# Patient Record
Sex: Female | Born: 1955 | Race: White | Hispanic: No | Marital: Married | Smoking: Former smoker
Health system: Southern US, Community
[De-identification: ages and names within clinical notes are randomized; demographics above are authoritative.]

## PROBLEM LIST (undated history)

## (undated) DIAGNOSIS — F419 Anxiety disorder, unspecified: Secondary | ICD-10-CM

## (undated) DIAGNOSIS — R51 Headache: Secondary | ICD-10-CM

## (undated) DIAGNOSIS — I8390 Asymptomatic varicose veins of unspecified lower extremity: Secondary | ICD-10-CM

## (undated) DIAGNOSIS — M5416 Radiculopathy, lumbar region: Secondary | ICD-10-CM

## (undated) DIAGNOSIS — T8859XA Other complications of anesthesia, initial encounter: Secondary | ICD-10-CM

## (undated) DIAGNOSIS — C50919 Malignant neoplasm of unspecified site of unspecified female breast: Secondary | ICD-10-CM

## (undated) DIAGNOSIS — Z923 Personal history of irradiation: Secondary | ICD-10-CM

## (undated) DIAGNOSIS — R002 Palpitations: Secondary | ICD-10-CM

## (undated) DIAGNOSIS — T4145XA Adverse effect of unspecified anesthetic, initial encounter: Secondary | ICD-10-CM

## (undated) DIAGNOSIS — R112 Nausea with vomiting, unspecified: Secondary | ICD-10-CM

## (undated) DIAGNOSIS — E785 Hyperlipidemia, unspecified: Secondary | ICD-10-CM

## (undated) DIAGNOSIS — K8020 Calculus of gallbladder without cholecystitis without obstruction: Secondary | ICD-10-CM

## (undated) DIAGNOSIS — R519 Headache, unspecified: Secondary | ICD-10-CM

## (undated) DIAGNOSIS — G8929 Other chronic pain: Secondary | ICD-10-CM

## (undated) DIAGNOSIS — Z9889 Other specified postprocedural states: Secondary | ICD-10-CM

## (undated) DIAGNOSIS — N809 Endometriosis, unspecified: Secondary | ICD-10-CM

## (undated) DIAGNOSIS — H8110 Benign paroxysmal vertigo, unspecified ear: Secondary | ICD-10-CM

## (undated) HISTORY — DX: Asymptomatic varicose veins of unspecified lower extremity: I83.90

## (undated) HISTORY — DX: Other chronic pain: G89.29

## (undated) HISTORY — PX: TONSILLECTOMY AND ADENOIDECTOMY: SUR1326

## (undated) HISTORY — DX: Anxiety disorder, unspecified: F41.9

## (undated) HISTORY — DX: Hyperlipidemia, unspecified: E78.5

## (undated) HISTORY — DX: Headache: R51

## (undated) HISTORY — PX: BREAST LUMPECTOMY: SHX2

## (undated) HISTORY — DX: Benign paroxysmal vertigo, unspecified ear: H81.10

## (undated) HISTORY — DX: Headache, unspecified: R51.9

## (undated) HISTORY — DX: Calculus of gallbladder without cholecystitis without obstruction: K80.20

## (undated) HISTORY — PX: COLONOSCOPY W/ POLYPECTOMY: SHX1380

## (undated) HISTORY — DX: Radiculopathy, lumbar region: M54.16

## (undated) HISTORY — DX: Endometriosis, unspecified: N80.9

## (undated) HISTORY — PX: CERVICAL POLYPECTOMY: SHX88

---

## 1997-10-28 ENCOUNTER — Other Ambulatory Visit: Admission: RE | Admit: 1997-10-28 | Discharge: 1997-10-28 | Payer: Self-pay | Admitting: Obstetrics and Gynecology

## 1998-10-29 ENCOUNTER — Other Ambulatory Visit: Admission: RE | Admit: 1998-10-29 | Discharge: 1998-10-29 | Payer: Self-pay | Admitting: Obstetrics and Gynecology

## 1999-12-08 ENCOUNTER — Other Ambulatory Visit: Admission: RE | Admit: 1999-12-08 | Discharge: 1999-12-08 | Payer: Self-pay | Admitting: Obstetrics and Gynecology

## 2001-02-21 ENCOUNTER — Other Ambulatory Visit: Admission: RE | Admit: 2001-02-21 | Discharge: 2001-02-21 | Payer: Self-pay | Admitting: Obstetrics and Gynecology

## 2002-04-08 ENCOUNTER — Other Ambulatory Visit: Admission: RE | Admit: 2002-04-08 | Discharge: 2002-04-08 | Payer: Self-pay | Admitting: Obstetrics and Gynecology

## 2003-07-25 ENCOUNTER — Other Ambulatory Visit: Admission: RE | Admit: 2003-07-25 | Discharge: 2003-07-25 | Payer: Self-pay | Admitting: Obstetrics and Gynecology

## 2010-06-01 ENCOUNTER — Emergency Department (HOSPITAL_COMMUNITY)
Admission: EM | Admit: 2010-06-01 | Discharge: 2010-06-02 | Disposition: A | Discharge status: Home / Self Care | Payer: BC Managed Care – PPO | Attending: Emergency Medicine | Admitting: Emergency Medicine

## 2010-06-01 DIAGNOSIS — R161 Splenomegaly, not elsewhere classified: Secondary | ICD-10-CM | POA: Insufficient documentation

## 2010-06-01 DIAGNOSIS — K8020 Calculus of gallbladder without cholecystitis without obstruction: Secondary | ICD-10-CM | POA: Insufficient documentation

## 2010-06-01 DIAGNOSIS — K7689 Other specified diseases of liver: Secondary | ICD-10-CM | POA: Insufficient documentation

## 2010-06-01 DIAGNOSIS — R109 Unspecified abdominal pain: Secondary | ICD-10-CM | POA: Insufficient documentation

## 2010-06-01 LAB — URINALYSIS, ROUTINE W REFLEX MICROSCOPIC
Glucose, UA: NEGATIVE mg/dL
Protein, ur: NEGATIVE mg/dL
Specific Gravity, Urine: 1.025 (ref 1.005–1.030)
pH: 5 (ref 5.0–8.0)

## 2010-06-01 LAB — DIFFERENTIAL
Eosinophils Absolute: 0.1 10*3/uL (ref 0.0–0.7)
Eosinophils Relative: 0 % (ref 0–5)
Lymphs Abs: 0.8 10*3/uL (ref 0.7–4.0)

## 2010-06-01 LAB — URINE MICROSCOPIC-ADD ON

## 2010-06-01 LAB — CBC
MCH: 30 pg (ref 26.0–34.0)
MCV: 89.2 fL (ref 78.0–100.0)
Platelets: 194 10*3/uL (ref 150–400)
RDW: 14.2 % (ref 11.5–15.5)
WBC: 14.1 10*3/uL — ABNORMAL HIGH (ref 4.0–10.5)

## 2010-06-02 ENCOUNTER — Emergency Department (HOSPITAL_COMMUNITY): Payer: BC Managed Care – PPO

## 2010-06-02 LAB — COMPREHENSIVE METABOLIC PANEL
Albumin: 4.5 g/dL (ref 3.5–5.2)
BUN: 15 mg/dL (ref 6–23)
Calcium: 9.4 mg/dL (ref 8.4–10.5)
Creatinine, Ser: 0.88 mg/dL (ref 0.4–1.2)
Potassium: 3.9 mEq/L (ref 3.5–5.1)
Total Protein: 7.5 g/dL (ref 6.0–8.3)

## 2010-06-14 ENCOUNTER — Other Ambulatory Visit (HOSPITAL_COMMUNITY): Payer: BC Managed Care – PPO

## 2010-06-18 ENCOUNTER — Ambulatory Visit (HOSPITAL_COMMUNITY): Admission: RE | Admit: 2010-06-18 | Payer: BC Managed Care – PPO | Source: Ambulatory Visit | Admitting: Surgery

## 2012-01-03 ENCOUNTER — Encounter: Payer: Self-pay | Admitting: Internal Medicine

## 2012-02-08 HISTORY — PX: BACK SURGERY: SHX140

## 2012-02-23 ENCOUNTER — Encounter: Payer: BC Managed Care – PPO | Admitting: Internal Medicine

## 2012-03-05 ENCOUNTER — Emergency Department (HOSPITAL_COMMUNITY)
Admission: EM | Admit: 2012-03-05 | Discharge: 2012-03-05 | Disposition: A | Discharge status: Home / Self Care | Payer: BC Managed Care – PPO | Attending: Emergency Medicine | Admitting: Emergency Medicine

## 2012-03-05 ENCOUNTER — Encounter (HOSPITAL_COMMUNITY): Payer: Self-pay | Admitting: Emergency Medicine

## 2012-03-05 DIAGNOSIS — IMO0002 Reserved for concepts with insufficient information to code with codable children: Secondary | ICD-10-CM | POA: Insufficient documentation

## 2012-03-05 DIAGNOSIS — G8929 Other chronic pain: Secondary | ICD-10-CM | POA: Insufficient documentation

## 2012-03-05 DIAGNOSIS — M5416 Radiculopathy, lumbar region: Secondary | ICD-10-CM

## 2012-03-05 MED ORDER — OXYCODONE HCL 15 MG PO TABS: 15.0000 mg | ORAL_TABLET | ORAL | Status: DC | PRN

## 2012-03-05 MED ORDER — METHYLPREDNISOLONE 4 MG PO KIT: PACK | ORAL | Status: DC

## 2012-03-05 MED ORDER — IBUPROFEN 800 MG PO TABS: 800.0000 mg | ORAL_TABLET | Freq: Three times a day (TID) | ORAL | Status: DC

## 2012-03-05 NOTE — ED Notes (Signed)
States took a pain pill already vicodin 1  1030-11 am today

## 2012-03-05 NOTE — ED Provider Notes (Signed)
History     CSN: [id number]  Arrival date & time 03/05/12  1139   First MD Initiated Contact with Patient 03/05/12 1323      No chief complaint on file.   (Consider location/radiation/quality/duration/timing/severity/associated sxs/prior treatment) HPI  Rhonda Cooley is a 57 y.o. female complaining of severe exacerbation 2 chronic low back pain that radiates down the left leg past the knee. Patient had an evaluation by both OB/GYN her primary care physician she was instructed to come to the ED. She has been hesitant to take pain medication because "she doesn't like taking pills." She has been prescribed Percocet 10 mg and she has taken half of a pill she denies any loss of bowel or bladder control, she is able to ambulate however it is painful, she denies any numbness or paresthesia, fever, history of IV drug use, history of cancer.  History reviewed. No pertinent past medical history.  History reviewed. No pertinent past surgical history.  History reviewed. No pertinent family history.  History  Substance Use Topics  . Smoking status: Not on file  . Smokeless tobacco: Not on file  . Alcohol Use: Not on file    OB History    Grav Para Term Preterm Abortions TAB SAB Ect Mult Living                  Review of Systems  Constitutional: Negative for fever.  Respiratory: Negative for shortness of breath.   Cardiovascular: Negative for chest pain.  Gastrointestinal: Negative for nausea, vomiting, abdominal pain and diarrhea.  Musculoskeletal: Positive for back pain.  All other systems reviewed and are negative.    Allergies  Review of patient's allergies indicates no known allergies.  Home Medications  No current outpatient prescriptions on file.  BP 132/83  Pulse 93  Temp 98.6 F (37 C)  SpO2 97%  Physical Exam  Nursing note and vitals reviewed. Constitutional: She is oriented to person, place, and time. She appears well-developed and well-nourished. No distress.    HENT:  Head: Normocephalic and atraumatic.  Mouth/Throat: Oropharynx is clear and moist.  Eyes: Conjunctivae normal and EOM are normal. Pupils are equal, round, and reactive to light.  Neck: Normal range of motion.  Cardiovascular: Normal rate, regular rhythm and intact distal pulses.   Pulmonary/Chest: Effort normal and breath sounds normal. No stridor. No respiratory distress. She has no wheezes. She has no rales. She exhibits no tenderness.  Abdominal: Soft. Bowel sounds are normal. She exhibits no distension and no mass. There is no tenderness. There is no rebound and no guarding.  Musculoskeletal: Normal range of motion.       Straight leg raise is positive on ipsilateral side at 40 and also positive on the contralateral side at 60  Neurological: She is alert and oriented to person, place, and time.       Strength is 5 out of 5x4 extremities, normal patellar reflexes, patient ambulates independently with a coordinated, slow and slightly antalgic gait.  Skin: Skin is warm.  Psychiatric: She has a normal mood and affect.    ED Course  Procedures (including critical care time)  Labs Reviewed - No data to display No results found.   1. Left lumbar radiculopathy       MDM  Patient states that she is sent to the ED by her primary care doctors work and for a MRI. Explained to her that an MRI is not indicated on an emergent basis based on her symptoms. And also  that the cost of the MRI might not be covered by her insurance if not emergently needed. Patient is very upset about this. I have offered her more substantial pain control medications which she has refused. On revisiting this she accepts a prescription for pain control medications and refuses anything in the ED. I will start her on a Medrol Dosepak, Motrin 800 mg and and Roxicodone 15 milligrams every 4 hours when necessary.  Advised her to follow with her primary care for arrangement of an outpatient MRI and have given her Dr.  Earl Gala contact information.   Pt verbalized understanding and agrees with care plan. Outpatient follow-up and return precautions given.    New Prescriptions   IBUPROFEN (ADVIL,MOTRIN) 800 MG TABLET    Take 1 tablet (800 mg total) by mouth 3 (three) times daily.   METHYLPREDNISOLONE (MEDROL DOSEPAK) 4 MG TABLET    As directed by package insert   OXYCODONE (ROXICODONE) 15 MG IMMEDIATE RELEASE TABLET    Take 1 tablet (15 mg total) by mouth every 4 (four) hours as needed for pain.          Wynetta Emery, PA-C 03/06/12 [phone]

## 2012-03-05 NOTE — ED Notes (Signed)
Pt refuses medication  

## 2012-03-05 NOTE — ED Notes (Signed)
Pain in back going down left leg no injury she states  Has had the same problem for over a year went to see gyn and they did not find anything

## 2012-03-06 ENCOUNTER — Encounter: Payer: BC Managed Care – PPO | Admitting: Internal Medicine

## 2012-03-06 NOTE — ED Provider Notes (Signed)
I personally performed the services described in this documentation, which was scribed in my presence. The recorded information has been reviewed and is accurate.   Carleene Cooper III, MD 03/06/12 [phone]

## 2012-03-08 ENCOUNTER — Other Ambulatory Visit: Payer: Self-pay | Admitting: Family Medicine

## 2012-03-08 DIAGNOSIS — M5410 Radiculopathy, site unspecified: Secondary | ICD-10-CM

## 2012-03-09 ENCOUNTER — Ambulatory Visit
Admission: RE | Admit: 2012-03-09 | Discharge: 2012-03-09 | Disposition: A | Discharge status: Home / Self Care | Payer: BC Managed Care – PPO | Source: Ambulatory Visit | Attending: Family Medicine | Admitting: Family Medicine

## 2012-03-09 DIAGNOSIS — M5410 Radiculopathy, site unspecified: Secondary | ICD-10-CM

## 2012-03-12 ENCOUNTER — Other Ambulatory Visit: Payer: BC Managed Care – PPO

## 2012-03-13 ENCOUNTER — Other Ambulatory Visit: Payer: BC Managed Care – PPO

## 2012-11-06 ENCOUNTER — Ambulatory Visit: Payer: BC Managed Care – PPO | Attending: Neurosurgery | Admitting: Physical Therapy

## 2012-11-06 DIAGNOSIS — IMO0001 Reserved for inherently not codable concepts without codable children: Secondary | ICD-10-CM | POA: Insufficient documentation

## 2012-11-06 DIAGNOSIS — M545 Low back pain, unspecified: Secondary | ICD-10-CM | POA: Insufficient documentation

## 2012-11-06 DIAGNOSIS — R5381 Other malaise: Secondary | ICD-10-CM | POA: Insufficient documentation

## 2012-11-08 ENCOUNTER — Ambulatory Visit: Payer: BC Managed Care – PPO | Attending: Neurosurgery | Admitting: Physical Therapy

## 2012-11-08 DIAGNOSIS — R5381 Other malaise: Secondary | ICD-10-CM | POA: Insufficient documentation

## 2012-11-08 DIAGNOSIS — IMO0001 Reserved for inherently not codable concepts without codable children: Secondary | ICD-10-CM | POA: Insufficient documentation

## 2012-11-08 DIAGNOSIS — M545 Low back pain, unspecified: Secondary | ICD-10-CM | POA: Insufficient documentation

## 2012-11-13 ENCOUNTER — Ambulatory Visit: Payer: BC Managed Care – PPO | Admitting: Physical Therapy

## 2012-11-20 ENCOUNTER — Ambulatory Visit: Payer: BC Managed Care – PPO | Admitting: Physical Therapy

## 2012-11-21 ENCOUNTER — Ambulatory Visit: Payer: BC Managed Care – PPO | Admitting: Physical Therapy

## 2012-11-28 ENCOUNTER — Ambulatory Visit: Payer: BC Managed Care – PPO | Admitting: Physical Therapy

## 2012-12-05 ENCOUNTER — Ambulatory Visit: Payer: BC Managed Care – PPO | Admitting: Physical Therapy

## 2012-12-13 ENCOUNTER — Ambulatory Visit: Payer: BC Managed Care – PPO | Attending: Neurosurgery | Admitting: Physical Therapy

## 2012-12-13 DIAGNOSIS — IMO0001 Reserved for inherently not codable concepts without codable children: Secondary | ICD-10-CM | POA: Insufficient documentation

## 2012-12-13 DIAGNOSIS — M545 Low back pain, unspecified: Secondary | ICD-10-CM | POA: Insufficient documentation

## 2012-12-13 DIAGNOSIS — R5381 Other malaise: Secondary | ICD-10-CM | POA: Insufficient documentation

## 2013-01-01 ENCOUNTER — Ambulatory Visit: Payer: BC Managed Care – PPO | Admitting: Physical Therapy

## 2015-03-06 ENCOUNTER — Encounter: Payer: Self-pay | Admitting: *Deleted

## 2015-03-09 ENCOUNTER — Encounter: Payer: Self-pay | Admitting: Diagnostic Neuroimaging

## 2015-03-09 ENCOUNTER — Ambulatory Visit (INDEPENDENT_AMBULATORY_CARE_PROVIDER_SITE_OTHER): Payer: BLUE CROSS/BLUE SHIELD | Admitting: Diagnostic Neuroimaging

## 2015-03-09 VITALS — BP 155/86 | HR 98 | Ht 63.0 in | Wt 212.4 lb

## 2015-03-09 DIAGNOSIS — R42 Dizziness and giddiness: Secondary | ICD-10-CM

## 2015-03-09 DIAGNOSIS — R269 Unspecified abnormalities of gait and mobility: Secondary | ICD-10-CM | POA: Diagnosis not present

## 2015-03-09 NOTE — Patient Instructions (Addendum)
Thank you for coming to see [place] at [facility]. I hope we have been able to provide you high quality care today.  You may receive a patient satisfaction survey over the next few weeks. We would appreciate your feedback and comments so that we may continue to improve ourselves and the health of our patients.  - I will check MRI brain  - Try vestibular therapy evaluation   ~~~~~~~~~~~~~~~~~~~~~~~~~~~~~~~~~~~~~~~~~~~~~~~~~~~~~~~~~~~~~~~~~  DR. PENUMALLI'S GUIDE TO HAPPY AND HEALTHY LIVING These are some of my general health and wellness recommendations. Some of them may apply to you better than others. Please use common sense as you try these suggestions and feel free to ask me any questions.   ACTIVITY/FITNESS Mental, social, emotional and physical stimulation are very important for brain and body health. Try learning a new activity (arts, music, language, sports, games).  Keep moving your body to the best of your abilities. You can do this at home, inside or outside, the park, community center, gym or anywhere you like. Consider a physical therapist or personal trainer to get started. Consider the app Sworkit. Fitness trackers such as smart-watches, smart-phones or Fitbits can help as well.   NUTRITION Eat more plants: colorful vegetables, nuts, seeds and berries.  Eat less sugar, salt, preservatives and processed foods.  Avoid toxins such as cigarettes and alcohol.  Drink water when you are thirsty. Warm water with a slice of lemon is an excellent morning drink to start the day.  Consider these websites for more information The Nutrition Source (https://www.henry-hernandez.biz/) Precision Nutrition (WindowBlog.ch)   RELAXATION Consider practicing mindfulness meditation or other relaxation techniques such as deep breathing, prayer, yoga, tai chi, massage. See website mindful.org or the apps Headspace or Calm to help get  started.   SLEEP Try to get at least 7-8+ hours sleep per day. Regular exercise and reduced caffeine will help you sleep better. Practice good sleep hygeine techniques. See website sleep.org for more information.   PLANNING Prepare estate planning, living will, healthcare POA documents. Sometimes this is best planned with the help of an attorney. Theconversationproject.org and agingwithdignity.org are excellent resources.

## 2015-03-09 NOTE — Progress Notes (Signed)
[facility]  PATIENT: Rhonda Cooley DOB: February 13, 1955  REFERRING CLINICIAN: Loraine Leriche HISTORY FROM: patient  REASON FOR VISIT: new consult    HISTORICAL  CHIEF COMPLAINT:  Chief Complaint  Patient presents with  . Vertigo    rm 7, New Patient, "one episode of dizziness, nausea, sensation of leaning/walking when I am not"    HISTORY OF PRESENT ILLNESS:   60 year old right-handed female here for evaluation of dizziness. 01/31/15 patient woke up with sensation of room spinning. She felt rapid heart rate, sweaty sensation of the back of her head and nausea and vomiting. Severe symptoms lasted 1-2 minutes. She had an uneasy sensation for a few hours. Over the next few weeks patient would have intermittent episodes of a moving sensation even though she was not moving. She had some balance difficulty and "woozy sensation". She's had some generalized weakness sensation. Symptoms may last for a few days and then remit for a few days.  Patient under increasing stress related to personal relationship issues since October 2016. No recent traumas or infections. Patient has been having higher blood pressure readings over last few weeks. Patient's son who is 64 years old also has similar symptoms.   REVIEW OF SYSTEMS: Full 14 system review of systems performed and notable only for dizziness anxiety.  ALLERGIES: No Known Allergies  HOME MEDICATIONS:  Outpatient Encounter Prescriptions as of 03/09/2015  Medication Sig  . aspirin 81 MG tablet Take 81 mg by mouth daily. 03/09/15 Takes on and off  . diazepam (VALIUM) 5 MG tablet 5 mg. Reported on 03/09/2015  . [DISCONTINUED] ibuprofen (ADVIL,MOTRIN) 200 MG tablet Take 200 mg by mouth every 4 (four) hours as needed. For pain  . [DISCONTINUED] ibuprofen (ADVIL,MOTRIN) 800 MG tablet Take 1 tablet (800 mg total) by mouth 3 (three) times daily.  . [DISCONTINUED] methylPREDNISolone (MEDROL DOSEPAK) 4 MG tablet As directed by package  insert  . [DISCONTINUED] oxyCODONE (ROXICODONE) 15 MG immediate release tablet Take 1 tablet (15 mg total) by mouth every 4 (four) hours as needed for pain.   No facility-administered encounter medications on file as of 03/09/2015.    PAST MEDICAL HISTORY: Past Medical History  Diagnosis Date  . BPPV (benign paroxysmal positional vertigo)   . Anxiety   . Gall stones   . Lumbar radiculopathy   . Chronic headaches   . Varicose veins   . Endometriosis     "went in and cleaned"    PAST SURGICAL HISTORY: Past Surgical History  Procedure Laterality Date  . Back surgery  2014  . Tonsillectomy and adenoidectomy      as child    FAMILY HISTORY: Family History  Problem Relation Age of Onset  . Alzheimer's disease Mother   . Hypertension Sister   . Hypertension Brother     SOCIAL HISTORY:  Social History   Social History  . Marital Status: Married    Spouse Name: Marcello Moores  . Number of Children: 3  . Years of Education: 14   Occupational History  .      works from home   Social History Main Topics  . Smoking status: Former Smoker    Types: Cigarettes    Quit date: 03/08/1988  . Smokeless tobacco: Not on file  . Alcohol Use: No  . Drug Use: No  . Sexual Activity: Not on file   Other Topics Concern  . Not on file   Social History Narrative   Lives at home with husband, child  Caffeine use- tea once a week     PHYSICAL EXAM  GENERAL EXAM/CONSTITUTIONAL: Vitals:  Filed Vitals:   03/09/15 1108  BP: 155/86  Pulse: 98  Height: '5\' 3"'$  (1.6 m)  Weight: 212 lb 6.4 oz (96.344 kg)     Body mass index is 37.63 kg/(m^2).  Visual Acuity Screening   Right eye Left eye Both eyes  Without correction:     With correction: 20/30 20/30      Patient is in no distress; well developed, nourished and groomed; neck is supple  CARDIOVASCULAR:  Examination of carotid arteries is normal; no carotid bruits  Regular rate and rhythm, no murmurs  Examination of  peripheral vascular system by observation and palpation is normal  EYES:  Ophthalmoscopic exam of optic discs and posterior segments is normal; no papilledema or hemorrhages  MUSCULOSKELETAL:  Gait, strength, tone, movements noted in Neurologic exam below  NEUROLOGIC: MENTAL STATUS:  No flowsheet data found.  awake, alert, oriented to person, place and time  recent and remote memory intact  normal attention and concentration  language fluent, comprehension intact, naming intact,   fund of knowledge appropriate  CRANIAL NERVE:   2nd - no papilledema on fundoscopic exam  2nd, 3rd, 4th, 6th - pupils equal and reactive to light, visual fields full to confrontation, extraocular muscles intact, no nystagmus  5th - facial sensation symmetric  7th - facial strength symmetric  8th - hearing intact  9th - palate elevates symmetrically, uvula midline  11th - shoulder shrug symmetric  12th - tongue protrusion midline  DIX HALLPIKE TESTING WITH HEAD TURNED LEFT TRIGGERS MILD SYMPTOMS, WITHOUT NYSTAGMUS  MOTOR:   normal bulk and tone, full strength in the BUE, BLE  SENSORY:   normal and symmetric to light touch, temperature, vibration  COORDINATION:   finger-nose-finger, fine finger movements normal  REFLEXES:   deep tendon reflexes present and symmetric  GAIT/STATION:   narrow based gait; romberg is negative    DIAGNOSTIC DATA (LABS, IMAGING, TESTING) - I reviewed patient records, labs, notes, testing and imaging myself where available.  Lab Results  Component Value Date   WBC 14.1* 06/01/2010   HGB 13.0 06/01/2010   HCT 38.7 06/01/2010   MCV 89.2 06/01/2010   PLT 194 06/01/2010      Component Value Date/Time   NA 138 06/01/2010 2310   K 3.9 06/01/2010 2310   CL 105 06/01/2010 2310   CO2 20 06/01/2010 2310   GLUCOSE 143* 06/01/2010 2310   BUN 15 06/01/2010 2310   CREATININE 0.88 06/01/2010 2310   CALCIUM 9.4 06/01/2010 2310   PROT 7.5  06/01/2010 2310   ALBUMIN 4.5 06/01/2010 2310   AST 41* 06/01/2010 2310   ALT 56* 06/01/2010 2310   ALKPHOS 103 06/01/2010 2310   BILITOT 1.4* 06/01/2010 2310   GFRNONAA >60 06/01/2010 2310   GFRAA  06/01/2010 2310    >60        The eGFR has been calculated using the MDRD equation. This calculation has not been validated in all clinical situations. eGFR's persistently <60 mL/min signify possible Chronic Kidney Disease.   No results found for: CHOL, HDL, LDLCALC, LDLDIRECT, TRIG, CHOLHDL No results found for: HGBA1C No results found for: VITAMINB12 No results found for: TSH      ASSESSMENT AND PLAN  60 y.o. year old female here with new onset intermittent vertigo, dizziness, balance difficulty, generalized weakness, since 01/31/15.   Ddx: benign positional vertigo, viral/post-viral labyrinthitis, other secondary causes (stroke, inflammation,  autoimmune, structural)  Vertigo - Plan: MR Brain W Wo Contrast, PT vestibular rehab  Gait difficulty - Plan: MR Brain W Wo Contrast, PT vestibular rehab    PLAN: - I will check MRI brain to rule out secondary causes - Try physical vestibular therapy  - monitor BP readings at home  Orders Placed This Encounter  Procedures  . MR Brain W Wo Contrast   Return in about 3 months (around 06/07/2015).    Penni Bombard, MD 04/04/3333, 45:62 AM Certified in Neurology, Neurophysiology and Neuroimaging  [facility] [address] [place], [place] [address] [phone]

## 2015-03-19 ENCOUNTER — Ambulatory Visit (INDEPENDENT_AMBULATORY_CARE_PROVIDER_SITE_OTHER): Payer: Self-pay

## 2015-03-19 DIAGNOSIS — R42 Dizziness and giddiness: Secondary | ICD-10-CM

## 2015-03-19 DIAGNOSIS — R269 Unspecified abnormalities of gait and mobility: Secondary | ICD-10-CM | POA: Diagnosis not present

## 2015-03-19 DIAGNOSIS — Z0289 Encounter for other administrative examinations: Secondary | ICD-10-CM

## 2015-03-26 ENCOUNTER — Telehealth: Payer: Self-pay | Admitting: Diagnostic Neuroimaging

## 2015-03-26 DIAGNOSIS — R42 Dizziness and giddiness: Secondary | ICD-10-CM

## 2015-03-26 DIAGNOSIS — R269 Unspecified abnormalities of gait and mobility: Secondary | ICD-10-CM

## 2015-03-26 NOTE — Telephone Encounter (Signed)
Yes. pls setup vestibular PT/rehab. -VRP

## 2015-03-26 NOTE — Telephone Encounter (Signed)
I spoke to pt and advised her that Dr. Leta Baptist reviewed her MRI results and reported that there are no major findings and is likely normal for age. Pt verbalized understanding.  Pt is asking for a referral to vestibular rehab as discussed in the office visit for her vertigo (if Dr. Leta Baptist still thinks this is appropriate) as well as a referral to an ENT for her vertigo. I advised pt that I would check with Dr. Leta Baptist, and our office would call her back to discuss those referrals if Dr. Leta Baptist believes that both are appropriate. Pt verbalized understanding.

## 2015-03-26 NOTE — Telephone Encounter (Signed)
Patient called to request results of MRI

## 2015-03-26 NOTE — Telephone Encounter (Signed)
Called and spoke to pt. Advised results not ready yet. I will forward message to Dr Leta Baptist and see if results are ready. I will call her back once I speak to Dr Leta Baptist. She verbalized understanding.

## 2015-03-26 NOTE — Telephone Encounter (Signed)
No major findings. Likely normal for age. -VRP

## 2015-03-27 NOTE — Telephone Encounter (Signed)
Called pt and relayed information about referrals. Advised they will be sent on Monday. Can take 1-2 weeks to process. She verbalized understanding.

## 2015-03-27 NOTE — Telephone Encounter (Signed)
Placed ENT/PT referrals as request by Dr Leta Baptist.

## 2015-04-09 ENCOUNTER — Ambulatory Visit: Payer: BLUE CROSS/BLUE SHIELD | Admitting: Physical Therapy

## 2015-04-15 ENCOUNTER — Ambulatory Visit: Payer: BLUE CROSS/BLUE SHIELD | Admitting: Physical Therapy

## 2015-04-20 ENCOUNTER — Ambulatory Visit: Payer: BLUE CROSS/BLUE SHIELD | Admitting: Diagnostic Neuroimaging

## 2015-05-14 ENCOUNTER — Other Ambulatory Visit: Payer: Self-pay | Admitting: Family Medicine

## 2015-05-14 DIAGNOSIS — R1011 Right upper quadrant pain: Secondary | ICD-10-CM

## 2016-03-05 ENCOUNTER — Emergency Department (HOSPITAL_COMMUNITY)
Admission: EM | Admit: 2016-03-05 | Discharge: 2016-03-05 | Disposition: A | Discharge status: Home / Self Care | Payer: BLUE CROSS/BLUE SHIELD | Attending: Emergency Medicine | Admitting: Emergency Medicine

## 2016-03-05 ENCOUNTER — Encounter (HOSPITAL_COMMUNITY): Payer: Self-pay | Admitting: *Deleted

## 2016-03-05 ENCOUNTER — Emergency Department (HOSPITAL_COMMUNITY): Payer: BLUE CROSS/BLUE SHIELD

## 2016-03-05 DIAGNOSIS — J111 Influenza due to unidentified influenza virus with other respiratory manifestations: Secondary | ICD-10-CM | POA: Diagnosis not present

## 2016-03-05 DIAGNOSIS — Z79899 Other long term (current) drug therapy: Secondary | ICD-10-CM | POA: Insufficient documentation

## 2016-03-05 DIAGNOSIS — Z87891 Personal history of nicotine dependence: Secondary | ICD-10-CM | POA: Diagnosis not present

## 2016-03-05 LAB — CBC WITH DIFFERENTIAL/PLATELET
Basophils Absolute: 0 10*3/uL (ref 0.0–0.1)
Basophils Relative: 1 %
EOS PCT: 0 %
Eosinophils Absolute: 0 10*3/uL (ref 0.0–0.7)
HEMATOCRIT: 32.2 % — AB (ref 36.0–46.0)
Hemoglobin: 10.5 g/dL — ABNORMAL LOW (ref 12.0–15.0)
LYMPHS PCT: 8 %
Lymphs Abs: 0.5 10*3/uL — ABNORMAL LOW (ref 0.7–4.0)
MCH: 27.9 pg (ref 26.0–34.0)
MCHC: 32.6 g/dL (ref 30.0–36.0)
MCV: 85.4 fL (ref 78.0–100.0)
MONO ABS: 0.6 10*3/uL (ref 0.1–1.0)
MONOS PCT: 9 %
NEUTROS ABS: 5.5 10*3/uL (ref 1.7–7.7)
Neutrophils Relative %: 82 %
PLATELETS: 177 10*3/uL (ref 150–400)
RBC: 3.77 MIL/uL — ABNORMAL LOW (ref 3.87–5.11)
RDW: 14.4 % (ref 11.5–15.5)
WBC: 6.7 10*3/uL (ref 4.0–10.5)

## 2016-03-05 LAB — URINALYSIS, ROUTINE W REFLEX MICROSCOPIC
Bilirubin Urine: NEGATIVE
GLUCOSE, UA: 50 mg/dL — AB
Hgb urine dipstick: NEGATIVE
KETONES UR: NEGATIVE mg/dL
LEUKOCYTES UA: NEGATIVE
NITRITE: NEGATIVE
Protein, ur: NEGATIVE mg/dL
Specific Gravity, Urine: 1.021 (ref 1.005–1.030)
pH: 5 (ref 5.0–8.0)

## 2016-03-05 LAB — COMPREHENSIVE METABOLIC PANEL
ALT: 36 U/L (ref 14–54)
ANION GAP: 8 (ref 5–15)
AST: 34 U/L (ref 15–41)
Albumin: 4.3 g/dL (ref 3.5–5.0)
Alkaline Phosphatase: 85 U/L (ref 38–126)
BILIRUBIN TOTAL: 1.3 mg/dL — AB (ref 0.3–1.2)
BUN: 12 mg/dL (ref 6–20)
CHLORIDE: 105 mmol/L (ref 101–111)
CO2: 21 mmol/L — ABNORMAL LOW (ref 22–32)
Calcium: 8.7 mg/dL — ABNORMAL LOW (ref 8.9–10.3)
Creatinine, Ser: 0.75 mg/dL (ref 0.44–1.00)
GFR calc Af Amer: >60 mL/min (ref >60)
Glucose, Bld: 180 mg/dL — ABNORMAL HIGH (ref 65–99)
POTASSIUM: 3.3 mmol/L — AB (ref 3.5–5.1)
Sodium: 134 mmol/L — ABNORMAL LOW (ref 135–145)
TOTAL PROTEIN: 7 g/dL (ref 6.5–8.1)

## 2016-03-05 LAB — I-STAT CG4 LACTIC ACID, ED: Lactic Acid, Venous: 1.02 mmol/L (ref 0.5–1.9)

## 2016-03-05 LAB — LIPASE, BLOOD: Lipase: 29 U/L (ref 11–51)

## 2016-03-05 MED ORDER — SODIUM CHLORIDE 0.9 % IV BOLUS (SEPSIS)
1000.0000 mL | Freq: Once | INTRAVENOUS | Status: AC
  Administered 2016-03-05: 1000 mL via INTRAVENOUS

## 2016-03-05 MED ORDER — ONDANSETRON 8 MG PO TBDP
8.0000 mg | ORAL_TABLET | Freq: Three times a day (TID) | ORAL | 0 refills | Status: DC | PRN
Start: 2016-03-05 — End: 2016-12-15

## 2016-03-05 MED ORDER — BENZONATATE 100 MG PO CAPS: 100.0000 mg | ORAL_CAPSULE | Freq: Three times a day (TID) | ORAL | 0 refills | Status: DC

## 2016-03-05 MED ORDER — KETOROLAC TROMETHAMINE 30 MG/ML IJ SOLN
30.0000 mg | Freq: Once | INTRAMUSCULAR | Status: AC
  Administered 2016-03-05: 30 mg via INTRAVENOUS
  Filled 2016-03-05: qty 1

## 2016-03-05 MED ORDER — ONDANSETRON HCL 4 MG/2ML IJ SOLN: 4.0000 mg | Freq: Once | INTRAMUSCULAR | Status: DC

## 2016-03-05 MED ORDER — METOCLOPRAMIDE HCL 5 MG/ML IJ SOLN
10.0000 mg | Freq: Once | INTRAMUSCULAR | Status: AC
  Administered 2016-03-05: 10 mg via INTRAVENOUS
  Filled 2016-03-05: qty 2

## 2016-03-05 NOTE — ED Notes (Signed)
Pt unable to provide urine sample at present time. Infusion continues.

## 2016-03-05 NOTE — Discharge Instructions (Signed)
Take ibuprofen and Tylenol for fever and chills. Drink plenty of fluids. Take Zofran as prescribed as needed for nausea and vomiting. Take Tessalon for cough. Follow-up with family doctor. Return to the emergency department if getting worse.

## 2016-03-05 NOTE — ED Notes (Signed)
Asked pt if she could give urine sample yet, but she said she is not able to.

## 2016-03-05 NOTE — ED Triage Notes (Addendum)
Per EMS, pt sent from urgent care for flu. Pt complains of cough, nausea, vomiting x 3 days. Pt tested positive for flu today. Pt has had 8mg  zofran, 1000mg  tylenol. Temp 102, BP 152/85, HR 98. O2 92% on RA.

## 2016-03-05 NOTE — ED Notes (Signed)
Pt transported to DG.  

## 2016-03-05 NOTE — ED Provider Notes (Signed)
[facility] DEPT Provider Note   CSN: [id number] Arrival date & time: 03/05/16  1636     History   Chief Complaint Chief Complaint  Patient presents with  . Influenza    HPI Rhonda Cooley is a 61 y.o. female.  HPI Rhonda Cooley is a 61 y.o. female with history of anxiety, and chronic back pain, presents to emergency department complaining of flulike symptoms. Patient states she has been sick for the last 3 days. She reports nausea, nasal congestion, cough, fever, body aches, back pain. Today she went to urgent care and tested positive for the flu. She was given 8 mg of Zofran IV for nausea, and 1 g of Tylenol by mouth for fever of 102. Because of her symptoms patient was transferred to emergency department. Patient states "I feel really really bad." At this point she admits to pain in her lower back and diffuse body aches. She denies any neck pain or stiffness. She reports cough and some shortness of breath. She denies any vomiting or diarrhea. She denies any chest pain or abdominal pain. Denies any urinary symptoms. She is otherwise healthy and does not take any medications. She has been taking ibuprofen for her fever at home, last dose taken this morning.  Past Medical History:  Diagnosis Date  . Anxiety   . BPPV (benign paroxysmal positional vertigo)   . Chronic headaches   . Endometriosis    "went in and cleaned"  . Gall stones   . Lumbar radiculopathy   . Varicose veins     There are no active problems to display for this patient.   Past Surgical History:  Procedure Laterality Date  . BACK SURGERY  2014  . TONSILLECTOMY AND ADENOIDECTOMY     as child    OB History    No data available       Home Medications    Prior to Admission medications   Medication Sig Start Date End Date Taking? Authorizing Provider  ibuprofen (ADVIL,MOTRIN) 200 MG tablet Take 200 mg by mouth every 6 (six) hours as needed.   Yes Historical Provider, MD    Family History Family  History  Problem Relation Age of Onset  . Alzheimer's disease Mother   . Hypertension Sister   . Hypertension Brother     Social History Social History  Substance Use Topics  . Smoking status: Former Smoker    Types: Cigarettes    Quit date: 03/08/1988  . Smokeless tobacco: Not on file  . Alcohol use No     Allergies   Patient has no known allergies.   Review of Systems Review of Systems  Constitutional: Positive for appetite change, chills and fever.  HENT: Positive for congestion.   Respiratory: Positive for cough. Negative for chest tightness and shortness of breath.   Cardiovascular: Negative for chest pain, palpitations and leg swelling.  Gastrointestinal: Positive for nausea. Negative for abdominal pain, diarrhea and vomiting.  Genitourinary: Negative for dysuria, flank pain and pelvic pain.  Musculoskeletal: Positive for back pain and myalgias. Negative for neck pain and neck stiffness.  Skin: Negative for rash.  Neurological: Positive for weakness and headaches. Negative for dizziness.  All other systems reviewed and are negative.    Physical Exam Updated Vital Signs BP 137/65 (BP Location: Right Arm)   Pulse 97   Temp 99.6 F (37.6 C) (Oral)   Resp 16   SpO2 97%   Physical Exam  Constitutional: She is oriented to person, place, and time. She  appears well-developed and well-nourished.  Ill-appearing  HENT:  Head: Normocephalic.  Right Ear: External ear normal.  Left Ear: External ear normal.  Nose: Nose normal.  Mouth/Throat: Oropharynx is clear and moist.  Eyes: Conjunctivae and EOM are normal. Pupils are equal, round, and reactive to light.  Neck: Normal range of motion. Neck supple.  No meningismus  Cardiovascular: Normal rate, regular rhythm and normal heart sounds.   Pulmonary/Chest: Effort normal and breath sounds normal. No respiratory distress. She has no wheezes. She has no rales.  Abdominal: Soft. Bowel sounds are normal. She exhibits no  distension. There is no tenderness. There is no rebound.  Musculoskeletal: She exhibits no edema.  Neurological: She is alert and oriented to person, place, and time.  Skin: Skin is warm and dry.  Psychiatric: She has a normal mood and affect. Her behavior is normal.  Nursing note and vitals reviewed.    ED Treatments / Results  Labs (all labs ordered are listed, but only abnormal results are displayed) Labs Reviewed  CBC WITH DIFFERENTIAL/PLATELET - Abnormal; Notable for the following:       Result Value   RBC 3.77 (*)    Hemoglobin 10.5 (*)    HCT 32.2 (*)    Lymphs Abs 0.5 (*)    All other components within normal limits  COMPREHENSIVE METABOLIC PANEL - Abnormal; Notable for the following:    Sodium 134 (*)    Potassium 3.3 (*)    CO2 21 (*)    Glucose, Bld 180 (*)    Calcium 8.7 (*)    Total Bilirubin 1.3 (*)    All other components within normal limits  URINALYSIS, ROUTINE W REFLEX MICROSCOPIC - Abnormal; Notable for the following:    APPearance HAZY (*)    Glucose, UA 50 (*)    All other components within normal limits  URINE CULTURE  LIPASE, BLOOD  I-STAT CG4 LACTIC ACID, ED    EKG  EKG Interpretation None       Radiology Dg Chest 2 View  Result Date: 03/05/2016 CLINICAL DATA:  Cough and congestion for 3 days. EXAM: CHEST  2 VIEW COMPARISON:  10/02/2007 chest radiograph FINDINGS: The cardiomediastinal silhouette is unremarkable. There is no evidence of focal airspace disease, pulmonary edema, suspicious pulmonary nodule/mass, pleural effusion, or pneumothorax. No acute bony abnormalities are identified. IMPRESSION: No active cardiopulmonary disease. Electronically Signed   By: Margarette Canada M.D.   On: 03/05/2016 18:09    Procedures Procedures (including critical care time)  Medications Ordered in ED Medications  sodium chloride 0.9 % bolus 1,000 mL (not administered)  sodium chloride 0.9 % bolus 1,000 mL (not administered)  metoCLOPramide (REGLAN)  injection 10 mg (not administered)     Initial Impression / Assessment and Plan / ED Course  I have reviewed the triage vital signs and the nursing notes.  Pertinent labs & imaging results that were available during my care of the patient were reviewed by me and considered in my medical decision making (see chart for details).    Patient in emergency department with influenza, tested positive today. She received 8 mg of Zofran by EMS of 1 g of Tylenol. She continues to have some nausea. I will give her some Reglan for her nausea, Toradol for body aches, will check labs, CXR. IV fluids ordered. VS normal at this time, no concern for sepsis.   8:46 PM Patient feels much better after 2 L of fluids, Toradol. She is drinking ginger ale with no  emesis or nausea. She states she feels much better. Her vital signs are all within normal. She's not tachycardic, hypertensive, tachypnea, hypoxic. She is comfortable with going home at this time. I suspect she probably was feeling so bad from high fever and dehydration. She will be going home with Tessalon, Zofran, close follow with primary care doctor. She is out of the window for Tamiflu. Discussed strict return precautions. Patient voiced understanding.  Vitals:   03/05/16 1900 03/05/16 1930 03/05/16 2001 03/05/16 2038  BP: 121/72 138/79 124/60 112/58  Pulse: 80 77 80 80  Resp: 14 17  18   Temp:      TempSrc:      SpO2: 94% 97% 99% 99%     Final Clinical Impressions(s) / ED Diagnoses   Final diagnoses:  Influenza    New Prescriptions Discharge Medication List as of 03/05/2016  8:48 PM    START taking these medications   Details  benzonatate (TESSALON) 100 MG capsule Take 1 capsule (100 mg total) by mouth every 8 (eight) hours., Starting Sat 03/05/2016, Print    ondansetron (ZOFRAN ODT) 8 MG disintegrating tablet Take 1 tablet (8 mg total) by mouth every 8 (eight) hours as needed for nausea or vomiting., Starting Sat 03/05/2016, Print          Jeannett Senior, PA-C 03/05/16 [place], MD 03/06/16 [phone]

## 2016-03-07 LAB — URINE CULTURE

## 2016-11-22 ENCOUNTER — Other Ambulatory Visit: Payer: Self-pay | Admitting: Obstetrics and Gynecology

## 2016-11-22 DIAGNOSIS — R928 Other abnormal and inconclusive findings on diagnostic imaging of breast: Secondary | ICD-10-CM

## 2016-11-25 ENCOUNTER — Ambulatory Visit
Admission: RE | Admit: 2016-11-25 | Discharge: 2016-11-25 | Disposition: A | Discharge status: Home / Self Care | Payer: BLUE CROSS/BLUE SHIELD | Source: Ambulatory Visit | Attending: Obstetrics and Gynecology | Admitting: Obstetrics and Gynecology

## 2016-11-25 ENCOUNTER — Other Ambulatory Visit: Payer: Self-pay | Admitting: Obstetrics and Gynecology

## 2016-11-25 DIAGNOSIS — N6310 Unspecified lump in the right breast, unspecified quadrant: Secondary | ICD-10-CM

## 2016-11-25 DIAGNOSIS — R928 Other abnormal and inconclusive findings on diagnostic imaging of breast: Secondary | ICD-10-CM

## 2016-12-02 ENCOUNTER — Ambulatory Visit
Admission: RE | Admit: 2016-12-02 | Discharge: 2016-12-02 | Disposition: A | Discharge status: Home / Self Care | Payer: BLUE CROSS/BLUE SHIELD | Source: Ambulatory Visit | Attending: Obstetrics and Gynecology | Admitting: Obstetrics and Gynecology

## 2016-12-02 ENCOUNTER — Other Ambulatory Visit: Payer: Self-pay | Admitting: Obstetrics and Gynecology

## 2016-12-02 ENCOUNTER — Other Ambulatory Visit: Payer: BLUE CROSS/BLUE SHIELD

## 2016-12-02 DIAGNOSIS — N6310 Unspecified lump in the right breast, unspecified quadrant: Secondary | ICD-10-CM

## 2016-12-02 DIAGNOSIS — C50919 Malignant neoplasm of unspecified site of unspecified female breast: Secondary | ICD-10-CM

## 2016-12-02 HISTORY — DX: Malignant neoplasm of unspecified site of unspecified female breast: C50.919

## 2016-12-06 ENCOUNTER — Ambulatory Visit: Payer: Self-pay | Admitting: Surgery

## 2016-12-06 DIAGNOSIS — C50911 Malignant neoplasm of unspecified site of right female breast: Secondary | ICD-10-CM

## 2016-12-06 DIAGNOSIS — Z170 Estrogen receptor positive status [ER+]: Principal | ICD-10-CM

## 2016-12-12 ENCOUNTER — Ambulatory Visit: Payer: Self-pay | Admitting: Surgery

## 2016-12-12 DIAGNOSIS — C50911 Malignant neoplasm of unspecified site of right female breast: Secondary | ICD-10-CM

## 2016-12-12 DIAGNOSIS — Z170 Estrogen receptor positive status [ER+]: Principal | ICD-10-CM

## 2016-12-13 ENCOUNTER — Encounter: Payer: Self-pay | Admitting: Radiation Oncology

## 2016-12-14 ENCOUNTER — Encounter: Payer: Self-pay | Admitting: Radiation Oncology

## 2016-12-14 NOTE — Progress Notes (Signed)
Location of Breast Cancer: Right Breast Upper outer Quadrant  Histology per Pathology Report: Diagnosis 10/26/218 Breast, right, needle core biopsy, upper outer, 11:30 o'clock - INVASIVE DUCTAL CARCINOMA - DUCTAL CARCINOMA IN SITU  Receptor Status: ER(100%+), PR(0%+,) Her2-neu (neg ratio 1.0), Ki-(5%)  Did patient present with symptoms (if so, please note symptoms) or was this found on screening mammography?: Routine mammogram  Past/Anticipated interventions by surgeon, if any:Dr. Alphonsa Overall, MD lumpectomy to be scheduled   Past/Anticipated interventions by medical oncology, if any: Chemotherapy : Dr. Burr Medico, 12/16/2016 appt  Lymphedema issues, if any:  No  Pain issues, if any:   No  SAFETY ISSUES: No  Prior radiation? no  Pacemaker/ICD?   Possible current   Pregnancy? NO  Is the patient on methotrexate?  No  Current Complaints / other details: Married, G2P2, 3 children, 1 adopted, BPPV,(benign paroxysmal vertigo) anxiety,Lumbar radiculopathy,colonoscopy 09/20/16=multiple polyps   former cigarette smoker ,quit 03/08/1988,no drug use or alcohol use,  Mother Alzheimer's,  Deceased,  Age 29,No family hx cancer   Allergies: NKA Rebecca Eaton, RN 12/14/2016,9:39 AM BP (!) 153/77   Pulse (!) 101   Temp 98.5 F (36.9 C) (Oral)   Resp 20   Ht '5\' 4"'$  (1.626 m)   Wt 211 lb (95.7 kg)   BMI 36.22 kg/m  Wt Readings from Last 3 Encounters:  12/15/16 211 lb (95.7 kg)  03/09/15 212 lb 6.4 oz (96.3 kg)

## 2016-12-15 ENCOUNTER — Ambulatory Visit
Admission: RE | Admit: 2016-12-15 | Discharge: 2016-12-15 | Disposition: A | Discharge status: Home / Self Care | Payer: BLUE CROSS/BLUE SHIELD | Source: Ambulatory Visit | Attending: Radiation Oncology | Admitting: Radiation Oncology

## 2016-12-15 ENCOUNTER — Encounter: Payer: Self-pay | Admitting: Radiation Oncology

## 2016-12-15 VITALS — BP 153/77 | HR 101 | Temp 98.5°F | Resp 20 | Ht 64.0 in | Wt 211.0 lb

## 2016-12-15 DIAGNOSIS — F419 Anxiety disorder, unspecified: Secondary | ICD-10-CM | POA: Insufficient documentation

## 2016-12-15 DIAGNOSIS — Z510 Encounter for antineoplastic radiation therapy: Secondary | ICD-10-CM | POA: Insufficient documentation

## 2016-12-15 DIAGNOSIS — Z87891 Personal history of nicotine dependence: Secondary | ICD-10-CM | POA: Diagnosis not present

## 2016-12-15 DIAGNOSIS — Z170 Estrogen receptor positive status [ER+]: Principal | ICD-10-CM

## 2016-12-15 DIAGNOSIS — C50411 Malignant neoplasm of upper-outer quadrant of right female breast: Secondary | ICD-10-CM | POA: Diagnosis not present

## 2016-12-15 HISTORY — DX: Malignant neoplasm of unspecified site of unspecified female breast: C50.919

## 2016-12-15 NOTE — Progress Notes (Signed)
Radiation Oncology         [phone] ________________________________  Name: Rhonda Cooley        MRN: [id number]  Date of Service: 12/15/2016 DOB: 1955-10-04  CC:Stephens Shire, MD  Alphonsa Overall, MD     REFERRING PHYSICIAN: Alphonsa Overall, MD   DIAGNOSIS: The encounter diagnosis was Malignant neoplasm of upper-outer quadrant of right breast in female, estrogen receptor positive ([place]).   HISTORY OF PRESENT ILLNESS: Rhonda Cooley is a 61 y.o. female seen at the request of Dr. Lucia Gaskins for a new diagnosis of right breast cancer. The patient noted a palpable mass and diagnostic imaging confirmed the palpable mass. On ultrasound it measured 13 x 10 x 11 mm in the upper outer quadrant at 11:30. Her axilla was negative for adenopathy. 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 She comes tomorrow to see Dr. Burr Medico and is waiting for surgical date to be scheduled. She comes today to discuss options of adjuvant radiotherapy.    PREVIOUS RADIATION THERAPY: No   PAST MEDICAL HISTORY:  Past Medical History:  Diagnosis Date  . Anxiety   . BPPV (benign paroxysmal positional vertigo)   . Breast cancer ([place]) 12/02/2016   right breast  . Chronic headaches   . Endometriosis    "went in and cleaned"  . Gall stones   . Lumbar radiculopathy   . Varicose veins        PAST SURGICAL HISTORY: Past Surgical History:  Procedure Laterality Date  . BACK SURGERY  2014  . TONSILLECTOMY AND ADENOIDECTOMY     as child     FAMILY HISTORY:  Family History  Problem Relation Age of Onset  . Alzheimer's disease Mother   . Hypertension Sister   . Hypertension Brother      SOCIAL HISTORY:  reports that she quit smoking about 28 years ago. Her smoking use included cigarettes. she has never used smokeless tobacco. She reports that she does not drink alcohol or use drugs. She is married and lives in [place]. She is  accompanied by her adult daughter. She also has a 85 year old child.    ALLERGIES: Patient has no known allergies.   MEDICATIONS:  Current Outpatient Medications  Medication Sig Dispense Refill  . ibuprofen (ADVIL,MOTRIN) 200 MG tablet Take 200 mg by mouth every 6 (six) hours as needed.    . vitamin C (ASCORBIC ACID) 500 MG tablet Take 500 mg as needed by mouth. When getting a cold takes 1/2 tablet     No current facility-administered medications for this encounter.      REVIEW OF SYSTEMS: On review of systems, the patient reports that she is doing well overall. She denies any chest pain, shortness of breath, cough, fevers, chills, night sweats, unintended weight changes. She denies any bowel or bladder disturbances, and denies abdominal pain, nausea or vomiting. She denies any new musculoskeletal or joint aches or pains. A complete review of systems is obtained and is otherwise negative.     PHYSICAL EXAM:  Wt Readings from Last 3 Encounters:  12/15/16 211 lb (95.7 kg)  03/09/15 212 lb 6.4 oz (96.3 kg)   Temp Readings from Last 3 Encounters:  12/15/16 98.5 F (36.9 C) (Oral)  03/05/16 98.5 F (36.9 C) (Oral)  03/05/12 98.6 F (37 C)   BP Readings from Last 3 Encounters:  12/15/16 (!) 153/77  03/05/16 112/58  03/09/15 Marland Kitchen)  155/86   Pulse Readings from Last 3 Encounters:  12/15/16 (!) 101  03/05/16 80  03/09/15 98   Pain Assessment Pain Score: 0-No pain/10  In general this is a well appearing Caucasian female in no acute distress. She is alert and oriented x4 and appropriate throughout the examination. HEENT reveals that the patient is normocephalic, atraumatic. EOMs are intact. PERRLA. Skin is intact without any evidence of gross lesions. Cardiopulmonary assessment is negative for acute distress and she exhibits normal effort. Breast exam is deferred to her postop visit.    ECOG = 0  0 - Asymptomatic (Fully active, able to carry on all predisease activities without  restriction)  1 - Symptomatic but completely ambulatory (Restricted in physically strenuous activity but ambulatory and able to carry out work of a light or sedentary nature. For example, light housework, office work)  2 - Symptomatic, <50% in bed during the day (Ambulatory and capable of all self care but unable to carry out any work activities. Up and about more than 50% of waking hours)  3 - Symptomatic, >50% in bed, but not bedbound (Capable of only limited self-care, confined to bed or chair 50% or more of waking hours)  4 - Bedbound (Completely disabled. Cannot carry on any self-care. Totally confined to bed or chair)  5 - Death   Eustace Pen MM, Creech RH, Tormey DC, et al. [phone]). "Toxicity and response criteria of the [facility]". [place] Oncol. 5 (6): 649-55    LABORATORY DATA:  Lab Results  Component Value Date   WBC 6.7 03/05/2016   HGB 10.5 (L) 03/05/2016   HCT 32.2 (L) 03/05/2016   MCV 85.4 03/05/2016   PLT 177 03/05/2016   Lab Results  Component Value Date   NA 134 (L) 03/05/2016   K 3.3 (L) 03/05/2016   CL 105 03/05/2016   CO2 21 (L) 03/05/2016   Lab Results  Component Value Date   ALT 36 03/05/2016   AST 34 03/05/2016   ALKPHOS 85 03/05/2016   BILITOT 1.3 (H) 03/05/2016      RADIOGRAPHY: US Breast Ltd Uni Right Inc Axilla  Result Date: 11/28/2016 CLINICAL DATA:  Screening recall for a right breast mass. EXAM: 2D DIGITAL DIAGNOSTIC UNILATERAL RIGHT MAMMOGRAM WITH CAD AND ADJUNCT TOMO RIGHT BREAST ULTRASOUND COMPARISON:  Previous exam(s). ACR Breast Density Category c: The breast tissue is heterogeneously dense, which may obscure small masses. FINDINGS: In the upper-outer quadrant of the right breast there is an irregular spiculated mass in the posterior depth. No definite mammographic abnormalities are identified in the upper-outer quadrant as seen on the screening mammogram, however ultrasound will be performed for further evaluation.  Mammographic images were processed with CAD. Ultrasound targeted to the right breast at 11:30, 4 cm from the nipple demonstrates an irregular hypoechoic mass measuring 1.3 x 1.0 x 1.1 cm. No other suspicious sonographic abnormalities are identified in the upper-outer quadrant. Multiple normal-appearing lymph nodes are identified in the right axilla. IMPRESSION: 1. There is an suspicious right breast mass at 11:30. 2.  No evidence of right axillary lymphadenopathy. RECOMMENDATION: Ultrasound-guided biopsy is recommended for the right breast mass. This has been scheduled for 12/02/2016 at 2:45 p.m. I have discussed the findings and recommendations with the patient. Results were also provided in writing at the conclusion of the visit. If applicable, a reminder letter will be sent to the patient regarding the next appointment. BI-RADS CATEGORY  5: Highly suggestive of malignancy. Electronically Signed  By: Ammie Ferrier M.D.   On: 11/25/2016 12:58   Mm Diag Breast Tomo Uni Right  Result Date: 11/25/2016 CLINICAL DATA:  Screening recall for a right breast mass. EXAM: 2D DIGITAL DIAGNOSTIC UNILATERAL RIGHT MAMMOGRAM WITH CAD AND ADJUNCT TOMO RIGHT BREAST ULTRASOUND COMPARISON:  Previous exam(s). ACR Breast Density Category c: The breast tissue is heterogeneously dense, which may obscure small masses. FINDINGS: In the upper-outer quadrant of the right breast there is an irregular spiculated mass in the posterior depth. No definite mammographic abnormalities are identified in the upper-outer quadrant as seen on the screening mammogram, however ultrasound will be performed for further evaluation. Mammographic images were processed with CAD. Ultrasound targeted to the right breast at 11:30, 4 cm from the nipple demonstrates an irregular hypoechoic mass measuring 1.3 x 1.0 x 1.1 cm. No other suspicious sonographic abnormalities are identified in the upper-outer quadrant. Multiple normal-appearing lymph nodes are  identified in the right axilla. IMPRESSION: 1. There is an suspicious right breast mass at 11:30. 2.  No evidence of right axillary lymphadenopathy. RECOMMENDATION: Ultrasound-guided biopsy is recommended for the right breast mass. This has been scheduled for 12/02/2016 at 2:45 p.m. I have discussed the findings and recommendations with the patient. Results were also provided in writing at the conclusion of the visit. If applicable, a reminder letter will be sent to the patient regarding the next appointment. BI-RADS CATEGORY  5: Highly suggestive of malignancy. Electronically Signed   By: Ammie Ferrier M.D.   On: 11/25/2016 12:58   Mm Clip Placement Right  Result Date: 12/02/2016 CLINICAL DATA:  Status post ultrasound-guided core biopsy of mass in the 11:30 o'clock location of the right breast. EXAM: DIAGNOSTIC RIGHT MAMMOGRAM POST ULTRASOUND BIOPSY COMPARISON:  Previous exam(s). FINDINGS: Mammographic images were obtained following ultrasound guided biopsy of mass in the 11:30 o'clock location of the right breast. A ribbon shaped clip is identified within the spiculated mass in the upper-outer quadrant of the right breast. IMPRESSION: Tissue marker clip in the expected location following biopsy. Final Assessment: Post Procedure Mammograms for Marker Placement Electronically Signed   By: Nolon Nations M.D.   On: 12/02/2016 10:08   [place] Rt Breast Bx W Loc Dev 1st Lesion Img Bx Spec US Guide  Addendum Date: 12/05/2016   ADDENDUM REPORT: 12/05/2016 12:16 ADDENDUM: PATHOLOGY ADDENDUM: Pathology right breast 11:30 o'clock location: Grade 1 invasive ductal carcinoma and DCIS Pathology concordance with imaging findings: Yes Recommendation: Consultation for treatment plan At the request of the patient, I spoke with her by telephone on 12/05/2016 at 11 a.m. She reports doing well after the biopsy . Patient is scheduled to see Dr. Lucia Gaskins on 01/06/2017. The office will contact the patient for an earlier  appointment time. Electronically Signed   By: Nolon Nations M.D.   On: 12/05/2016 12:16   Result Date: 12/05/2016 CLINICAL DATA:  Patient presents for ultrasound-guided core biopsy of mass in the right breast. EXAM: ULTRASOUND GUIDED RIGHT BREAST CORE NEEDLE BIOPSY COMPARISON:  Previous exam(s). FINDINGS: I met with the patient and we discussed the procedure of ultrasound-guided biopsy, including benefits and alternatives. We discussed the high likelihood of a successful procedure. We discussed the risks of the procedure, including infection, bleeding, tissue injury, clip migration, and inadequate sampling. Informed written consent was given. The usual time-out protocol was performed immediately prior to the procedure. Lesion quadrant: Upper-outer quadrant right breast Using sterile technique and 1% Lidocaine as local anesthetic, under direct ultrasound visualization, a 12 gauge spring-loaded device was  used to perform biopsy of mass in the 11:30 o'clock location the right breast using a inferior to superior approach. At the conclusion of the procedure a ribbon shaped tissue marker clip was deployed into the biopsy cavity. Follow up 2 view mammogram was performed and dictated separately. IMPRESSION: Ultrasound guided biopsy of right breast mass. No apparent complications. Electronically Signed: By: Nolon Nations M.D. On: 12/02/2016 10:07       IMPRESSION/PLAN: 1. Stage IA [id number] grade 1, ER positive, invasive ductal carcinoma with DCIS of the right breast. Dr. Lisbeth Renshaw discusses the pathology findings and reviews the nature of  breast disease. She is getting ready to proceed with conservation with lumpectomy with  sentinel mapping. We would anticipate that Dr. Burr Medico recommends proceeding with oncotype dx score to determine a role for systemic therapy. Provided that chemotherapy is not indicated, the patient's course would then be followed by external radiotherapy to the breast followed by antiestrogen  therapy. We discussed the risks, benefits, short, and long term effects of radiotherapy, and the patient is interested in proceeding. Dr. Lisbeth Renshaw discusses the delivery and logistics of radiotherapy and anticipates a course of 4 or 6 1/2 weeks, hopefully 4 weeks. We will see her back about 2 weeks after surgery to move forward with the simulation and planning process and anticipate starting radiotherapy about 4 weeks after surgery.   In a visit lasting 45 minutes, greater than 50% of the time was spent face to face discussing options of radiotherapy, and coordinating the patient's care.   The above documentation reflects my direct findings during this shared patient visit. Please see the separate note by Dr. Lisbeth Renshaw on this date for the remainder of the patient's plan of care.    Carola Rhine, PAC

## 2016-12-15 NOTE — Progress Notes (Signed)
Please see the Nurse Progress Note in the MD Initial Consult Encounter for this patient. 

## 2016-12-16 ENCOUNTER — Encounter: Payer: Self-pay | Admitting: Hematology

## 2016-12-16 ENCOUNTER — Ambulatory Visit (HOSPITAL_BASED_OUTPATIENT_CLINIC_OR_DEPARTMENT_OTHER): Payer: BLUE CROSS/BLUE SHIELD | Admitting: Hematology

## 2016-12-16 VITALS — BP 138/78 | HR 97 | Temp 98.5°F | Resp 18 | Ht 64.0 in | Wt 214.3 lb

## 2016-12-16 DIAGNOSIS — Z87891 Personal history of nicotine dependence: Secondary | ICD-10-CM

## 2016-12-16 DIAGNOSIS — R05 Cough: Secondary | ICD-10-CM | POA: Diagnosis not present

## 2016-12-16 DIAGNOSIS — J029 Acute pharyngitis, unspecified: Secondary | ICD-10-CM

## 2016-12-16 DIAGNOSIS — N951 Menopausal and female climacteric states: Secondary | ICD-10-CM

## 2016-12-16 DIAGNOSIS — Z170 Estrogen receptor positive status [ER+]: Secondary | ICD-10-CM

## 2016-12-16 DIAGNOSIS — Z86010 Personal history of colonic polyps: Secondary | ICD-10-CM

## 2016-12-16 DIAGNOSIS — C50411 Malignant neoplasm of upper-outer quadrant of right female breast: Secondary | ICD-10-CM

## 2016-12-16 DIAGNOSIS — D649 Anemia, unspecified: Secondary | ICD-10-CM | POA: Diagnosis not present

## 2016-12-16 NOTE — Progress Notes (Addendum)
[place]  [phone] Fax:([phone] [place] Note   Patient Care Team: Stephens Shire, MD as PCP - General (Family Medicine) 12/16/2016  CHIEF COMPLAINTS/PURPOSE OF CONSULTATION:  Right breast cancer     Malignant neoplasm of upper-outer quadrant of right breast in female, estrogen receptor positive ([place])   11/25/2016 Mammogram    FINDINGS: In the upper-outer quadrant of the right breast there is an irregular spiculated mass in the posterior depth. No definite mammographic abnormalities are identified in the upper-outer quadrant as seen on the screening mammogram, however ultrasound will be performed for further evaluation.  Mammographic images were processed with CAD.  Ultrasound targeted to the right breast at 11:30, 4 cm from the nipple demonstrates an irregular hypoechoic mass measuring 1.3 x 1.0 x 1.1 cm. No other suspicious sonographic abnormalities are identified in the upper-outer quadrant. Multiple normal-appearing lymph nodes are identified in the right axilla.  IMPRESSION: 1. There is an suspicious right breast mass at 11:30.  2.  No evidence of right axillary lymphadenopathy.  RECOMMENDATION: Ultrasound-guided biopsy is recommended for the right breast mass. This has been scheduled for 12/02/2016 at 2:45 p.m.       11/25/2016 Breast US    FINDINGS: In the upper-outer quadrant of the right breast there is an irregular spiculated mass in the posterior depth. No definite mammographic abnormalities are identified in the upper-outer quadrant as seen on the screening mammogram, however ultrasound will be performed for further evaluation.  Mammographic images were processed with CAD.  Ultrasound targeted to the right breast at 11:30, 4 cm from the nipple demonstrates an irregular hypoechoic mass measuring 1.3 x 1.0 x 1.1 cm. No other suspicious sonographic abnormalities are identified in the  upper-outer quadrant. Multiple normal-appearing lymph nodes are identified in the right axilla.  IMPRESSION: 1. There is an suspicious right breast mass at 11:30.  2.  No evidence of right axillary lymphadenopathy.  RECOMMENDATION: Ultrasound-guided biopsy is recommended for the right breast mass. This has been scheduled for 12/02/2016 at 2:45 p.m.       12/02/2016 Procedure    Tissue Clip placement FINDINGS: Mammographic images were obtained following ultrasound guided biopsy of mass in the 11:30 o'clock location of the right breast. A ribbon shaped clip is identified within the spiculated mass in the upper-outer quadrant of the right breast.  IMPRESSION: Tissue marker clip in the expected location following biopsy.  Final Assessment: Post Procedure Mammograms for Marker Placement       12/02/2016 Initial Biopsy    Diagnosis Breast, right, needle core biopsy, upper outer, 11:30 o'clock - INVASIVE DUCTAL CARCINOMA - DUCTAL CARCINOMA IN SITU - SEE COMMENT  IMMUNOHISTOCHEMICAL AND MORPHOMETRIC ANALYSIS PERFORMED MANUALLY Estrogen Receptor: 100%, POSITIVE, STRONG STAINING INTENSITY Progesterone Receptor: 60%, POSITIVE, STRONG STAINING INTENSITY Proliferation Marker Ki67: 5% HER2 - Negative      12/02/2016 Initial Diagnosis    Malignant neoplasm of upper-outer quadrant of right breast in female, estrogen receptor positive ([place])      HISTORY OF PRESENTING ILLNESS:  Shanvi Moyd 61 y.o. female is here because of newly diagnosed right breast cancer. She was referred by Dr. Lucia Gaskins. She presents with her daughter today. The mass was found by screening mammogram, she denies palpable mass before the biopsy. She noticed hot flashes over the last 6 months and recurrent sore throat and dry cough recently, otherwise she feels well. She denies fatigue, weight loss, or breast changes.   In the past she has had allergies,  anxiety, lumbar radiculopathy, BPPV, gallstones, chronic  headaches, varicose veins and endometriosis. She has had back surgery, tonsillectomy and adenoidectomy, and surgery for endometriosis. Does not take medication. She is very sensitive to medication in the past. She has history of smoking 0.5 PPD x15 years, quit in 1990; denies alcohol use. She works from home and is independent with ADL's. She is up to date on PAP smear and colonoscopy in 09/2016, sigmoid polyp and rectal polyp showed fragments of hyperplasia, negative for malignancy. Routine labs in 09/2016 shows her to be mildly anemic, Hgb 11.9; Hgb in 02/2016 was 10.5; she had normal Hgb in 2012. She reports a uterine polyp was removed and benign. There is no family history of malignancy.   GYN HISTORY: Menarchal: age 61 LMP: age 30? Contraceptive: No HRT: No 2 pregnancies, 2 births, 3 children (1 adopted)  MEDICAL HISTORY:  Past Medical History:  Diagnosis Date  . Anxiety   . BPPV (benign paroxysmal positional vertigo)   . Breast cancer ([place]) 12/02/2016   right breast  . Chronic headaches   . Endometriosis    "went in and cleaned"  . Gall stones   . Lumbar radiculopathy   . Varicose veins     SURGICAL HISTORY: Past Surgical History:  Procedure Laterality Date  . BACK SURGERY  2014  . TONSILLECTOMY AND ADENOIDECTOMY     as child    SOCIAL HISTORY: Social History   Socioeconomic History  . Marital status: Married    Spouse name: Marcello Moores  . Number of children: 3  . Years of education: 37  . Highest education level: Not on file  Social Needs  . Financial resource strain: Not on file  . Food insecurity - worry: Not on file  . Food insecurity - inability: Not on file  . Transportation needs - medical: Not on file  . Transportation needs - non-medical: Not on file  Occupational History    Comment: works from home  Tobacco Use  . Smoking status: Former Smoker    Packs/day: 0.50    Years: 15.00    Pack years: 7.50    Types: Cigarettes    Last attempt to quit: 03/08/1988      Years since quitting: 28.7  . Smokeless tobacco: Never Used  Substance and Sexual Activity  . Alcohol use: No    Alcohol/week: 0.0 oz  . Drug use: No  . Sexual activity: Not on file  Other Topics Concern  . Not on file  Social History Narrative   Lives at home with husband, child   Caffeine use- tea once a week    FAMILY HISTORY: Family History  Problem Relation Age of Onset  . Alzheimer's disease Mother   . Hypertension Sister   . Hypertension Brother     ALLERGIES:  has No Known Allergies.  MEDICATIONS:  Current Outpatient Medications  Medication Sig Dispense Refill  . nystatin-triamcinolone (MYCOLOG II) cream   0   No current facility-administered medications for this visit.     REVIEW OF SYSTEMS:   Constitutional: Denies fevers, chills or abnormal night sweats (+) intermittent hot flashes x6 months Eyes: Denies blurriness of vision, double vision or watery eyes Ears, nose, mouth, throat, and face: Denies mucositis (+) intermittent sore throat and dry cough  Respiratory: Denies dyspnea or wheezes (+) dry cough Cardiovascular: Denies palpitation, chest discomfort or lower extremity swelling Gastrointestinal:  Denies nausea, vomiting, constipation, diarrhea, heartburn or change in bowel habits Skin: Denies abnormal skin rashes Lymphatics: Denies new  lymphadenopathy or easy bruising Neurological:Denies numbness, tingling or new weaknesses Behavioral/Psych: Mood is stable, no new changes  All other systems were reviewed with the patient and are negative.  PHYSICAL EXAMINATION: ECOG PERFORMANCE STATUS: 1 - Symptomatic but completely ambulatory  Vitals:   12/16/16 1449  BP: 138/78  Pulse: 97  Resp: 18  Temp: 98.5 F (36.9 C)  SpO2: 100%   Filed Weights   12/16/16 1449  Weight: 214 lb 4.8 oz (97.2 kg)    GENERAL:alert, no distress and comfortable SKIN: skin color, texture, turgor are normal, no rashes or significant lesions EYES: normal, conjunctiva are  pink and non-injected, sclera clear OROPHARYNX:no exudate, no erythema and lips, buccal mucosa, and tongue normal  NECK: supple, thyroid normal size, non-tender, without nodularity LYMPH:  no palpable cervical, supraclavicular, axillary, or inguinal lymphadenopathy LUNGS: clear to auscultation bilaterally with normal breathing effort HEART: regular rate & rhythm and no murmurs and no lower extremity edema ABDOMEN:abdomen soft, non-tender and normal bowel sounds Musculoskeletal:no cyanosis of digits and no clubbing  PSYCH: alert & oriented x 3 with fluent speech NEURO: no focal motor/sensory deficits  LABORATORY DATA:  I have reviewed the data as listed CBC Latest Ref Rng & Units 03/05/2016 06/01/2010  WBC 4.0 - 10.5 K/uL 6.7 14.1(H)  Hemoglobin 12.0 - 15.0 g/dL 10.5(L) 13.0  Hematocrit 36.0 - 46.0 % 32.2(L) 38.7  Platelets 150 - 400 K/uL 177 194   Recent labs in [place] on 09/28/16:  WBC 9.3 RBC 4.13 HGB 11.9 HCT 36.2 MCV 87.8 MCH 29.0 RDW 15.0 Normal differential  Chemistry panel notable for elevated glucose to 107, otherwise WNL  RADIOGRAPHIC STUDIES: I have personally reviewed the radiological images as listed and agreed with the findings in the report. US Breast Ltd Uni Right Inc Axilla  Result Date: 11/28/2016 CLINICAL DATA:  Screening recall for a right breast mass. EXAM: 2D DIGITAL DIAGNOSTIC UNILATERAL RIGHT MAMMOGRAM WITH CAD AND ADJUNCT TOMO RIGHT BREAST ULTRASOUND COMPARISON:  Previous exam(s). ACR Breast Density Category c: The breast tissue is heterogeneously dense, which may obscure small masses. FINDINGS: In the upper-outer quadrant of the right breast there is an irregular spiculated mass in the posterior depth. No definite mammographic abnormalities are identified in the upper-outer quadrant as seen on the screening mammogram, however ultrasound will be performed for further evaluation. Mammographic images were processed with CAD. Ultrasound targeted to the  right breast at 11:30, 4 cm from the nipple demonstrates an irregular hypoechoic mass measuring 1.3 x 1.0 x 1.1 cm. No other suspicious sonographic abnormalities are identified in the upper-outer quadrant. Multiple normal-appearing lymph nodes are identified in the right axilla. IMPRESSION: 1. There is an suspicious right breast mass at 11:30. 2.  No evidence of right axillary lymphadenopathy. RECOMMENDATION: Ultrasound-guided biopsy is recommended for the right breast mass. This has been scheduled for 12/02/2016 at 2:45 p.m. I have discussed the findings and recommendations with the patient. Results were also provided in writing at the conclusion of the visit. If applicable, a reminder letter will be sent to the patient regarding the next appointment. BI-RADS CATEGORY  5: Highly suggestive of malignancy. Electronically Signed   By: Ammie Ferrier M.D.   On: 11/25/2016 12:58   Mm Diag Breast Tomo Uni Right  Result Date: 11/25/2016 CLINICAL DATA:  Screening recall for a right breast mass. EXAM: 2D DIGITAL DIAGNOSTIC UNILATERAL RIGHT MAMMOGRAM WITH CAD AND ADJUNCT TOMO RIGHT BREAST ULTRASOUND COMPARISON:  Previous exam(s). ACR Breast Density Category c: The breast tissue is heterogeneously  dense, which may obscure small masses. FINDINGS: In the upper-outer quadrant of the right breast there is an irregular spiculated mass in the posterior depth. No definite mammographic abnormalities are identified in the upper-outer quadrant as seen on the screening mammogram, however ultrasound will be performed for further evaluation. Mammographic images were processed with CAD. Ultrasound targeted to the right breast at 11:30, 4 cm from the nipple demonstrates an irregular hypoechoic mass measuring 1.3 x 1.0 x 1.1 cm. No other suspicious sonographic abnormalities are identified in the upper-outer quadrant. Multiple normal-appearing lymph nodes are identified in the right axilla. IMPRESSION: 1. There is an suspicious right  breast mass at 11:30. 2.  No evidence of right axillary lymphadenopathy. RECOMMENDATION: Ultrasound-guided biopsy is recommended for the right breast mass. This has been scheduled for 12/02/2016 at 2:45 p.m. I have discussed the findings and recommendations with the patient. Results were also provided in writing at the conclusion of the visit. If applicable, a reminder letter will be sent to the patient regarding the next appointment. BI-RADS CATEGORY  5: Highly suggestive of malignancy. Electronically Signed   By: Ammie Ferrier M.D.   On: 11/25/2016 12:58   Mm Clip Placement Right  Result Date: 12/02/2016 CLINICAL DATA:  Status post ultrasound-guided core biopsy of mass in the 11:30 o'clock location of the right breast. EXAM: DIAGNOSTIC RIGHT MAMMOGRAM POST ULTRASOUND BIOPSY COMPARISON:  Previous exam(s). FINDINGS: Mammographic images were obtained following ultrasound guided biopsy of mass in the 11:30 o'clock location of the right breast. A ribbon shaped clip is identified within the spiculated mass in the upper-outer quadrant of the right breast. IMPRESSION: Tissue marker clip in the expected location following biopsy. Final Assessment: Post Procedure Mammograms for Marker Placement Electronically Signed   By: Nolon Nations M.D.   On: 12/02/2016 10:08   [place] Rt Breast Bx W Loc Dev 1st Lesion Img Bx Spec US Guide  Addendum Date: 12/05/2016   ADDENDUM REPORT: 12/05/2016 12:16 ADDENDUM: PATHOLOGY ADDENDUM: Pathology right breast 11:30 o'clock location: Grade 1 invasive ductal carcinoma and DCIS Pathology concordance with imaging findings: Yes Recommendation: Consultation for treatment plan At the request of the patient, I spoke with her by telephone on 12/05/2016 at 11 a.m. She reports doing well after the biopsy . Patient is scheduled to see Dr. Lucia Gaskins on 01/06/2017. The office will contact the patient for an earlier appointment time. Electronically Signed   By: Nolon Nations M.D.   On:  12/05/2016 12:16   Result Date: 12/05/2016 CLINICAL DATA:  Patient presents for ultrasound-guided core biopsy of mass in the right breast. EXAM: ULTRASOUND GUIDED RIGHT BREAST CORE NEEDLE BIOPSY COMPARISON:  Previous exam(s). FINDINGS: I met with the patient and we discussed the procedure of ultrasound-guided biopsy, including benefits and alternatives. We discussed the high likelihood of a successful procedure. We discussed the risks of the procedure, including infection, bleeding, tissue injury, clip migration, and inadequate sampling. Informed written consent was given. The usual time-out protocol was performed immediately prior to the procedure. Lesion quadrant: Upper-outer quadrant right breast Using sterile technique and 1% Lidocaine as local anesthetic, under direct ultrasound visualization, a 12 gauge spring-loaded device was used to perform biopsy of mass in the 11:30 o'clock location the right breast using a inferior to superior approach. At the conclusion of the procedure a ribbon shaped tissue marker clip was deployed into the biopsy cavity. Follow up 2 view mammogram was performed and dictated separately. IMPRESSION: Ultrasound guided biopsy of right breast mass. No apparent complications. Electronically Signed:  By: Nolon Nations M.D. On: 12/02/2016 10:07    ASSESSMENT & PLAN:  61 year old postmenopausal female with allergies, anxiety, endometriosis presents with right breast cancer.   1. 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 Given her early stage disease, she would likely be a candidate for breast conservation therapy. She was seen by Dr. Lucia Gaskins and likely will proceed with surgery soon. We reviewed the risk of cancer recurrence after surgery and the role of adjuvant chemotherapy to reduce that risk. She is very reluctant to  accept chemotherapy. Given this, and her small tumor size, ER/PR positive and node-negative disease, I recommend a Oncotype Dx test on the surgical sample which will help guide our recommendation and her decision about adjuvant chemotherapy based on the Oncotype result. Written material of this test was given to her. She is young and fit, would be a good candidate for chemotherapy if her Oncotype recurrence score is high. She will think about it and call us prior to surgery with her decision. Given the strong ER and PR expression in her postmenopausal status, she is a candidate for adjuvant endocrine therapy with aromatase inhibitor for a total of 5-10 years to reduce the risk of cancer recurrence. Potential benefits and side effects were discussed with patient and she is interested. She has seen radiation oncologist Dr. Lisbeth Renshaw. He plans to treat with adjuvant radiation with tangent whole breast radiation for 4-6.5 weeks. We also discussed the breast cancer surveillance after her surgery. She will continue annual screening mammogram, self exam, and a routine office visit with lab and exam with [place]. I encouraged her to eat healthy and exercise regularly.   2. Anemia She is mildly anemic on CBC in January and august 2018, Hgb 11.9. She is asymptomatic, denies bleeding. She had colonoscopy with polypectomy in 09/2016, negative for malignancy. We will continue to monitor closely.   PLAN -Patient will call to let us know if she elects oncotype tissue test  -Will call her after surgery to discuss results; will arrange f/u if oncotype is intermediate or high risk -F/u after radiation therapy is complete  All questions were answered. The patient knows to call the clinic with any problems, questions or concerns. I spent 45 minutes counseling the patient face to face. The total time spent in the appointment was 50 minutes and more than 50% was on counseling, review of test results, and coordination of care.      Alla Feeling, NP 12/16/2016 4:07 PM  Addendum  I have seen the patient, examined her. I agree with the assessment and and plan and have edited the notes.   Ms Sanabia was recently diagnosed with ER and PR positive, HER-2 negative stage IA right breast cancer, with a low Ki-67 and grade 1, I suspect this is low risk disease.  If her tumor is more than 1 cm on final surgical tissue, I recommend her to have Oncotype test for risk stratification.  Patient is very reluctant to have chemotherapy and antiestrogen therapy, we discussed the benefits and potential side effects in great details, I answered all her questions.  She will call us back if she agrees with Oncotype.  I plan to see her back after her radiation, to finalize her adjuvant antiestrogen therapy, or sooner if her Oncotype RS>25.   Truitt Merle  12/16/2016

## 2016-12-19 ENCOUNTER — Telehealth: Payer: Self-pay | Admitting: *Deleted

## 2016-12-19 NOTE — Telephone Encounter (Signed)
Called pt with navigation resources and contact information. Pt denies questions or concerns regarding dx or treatment care plan. Pt has decided to move forward with oncotype testing. Physician team notified.

## 2016-12-20 ENCOUNTER — Other Ambulatory Visit: Payer: Self-pay | Admitting: Surgery

## 2016-12-20 ENCOUNTER — Telehealth: Payer: Self-pay | Admitting: *Deleted

## 2016-12-20 DIAGNOSIS — C50911 Malignant neoplasm of unspecified site of right female breast: Secondary | ICD-10-CM

## 2016-12-20 DIAGNOSIS — Z170 Estrogen receptor positive status [ER+]: Principal | ICD-10-CM

## 2016-12-21 NOTE — Telephone Encounter (Signed)
  Oncology Nurse Navigator Documentation      )                                                           

## 2016-12-22 NOTE — Pre-Procedure Instructions (Signed)
Daffney Greenly  12/22/2016      [facility] ([facility] [place] [address] [place] ([place]) [place] [address] Phone: [phone] Fax: [phone]    Your procedure is scheduled on Monday, December 26, 2016  Report to [facility] Admitting Entrance "A" at 10:00A.M.   Call this number if you have problems the morning of surgery:  [phone]   Remember:  Do not eat food or drink liquids after midnight.  Take these medicines the morning of surgery with A SIP OF WATER: If needed [facility]) for congestion.  As of today, stop taking all Aspirins, Vitamins, Fish oils, and Herbal medications. Also stop all NSAIDS i.e. Advil, Ibuprofen, Motrin, Aleve, Anaprox, Naproxen, BC and Goody Powders.  Please complete your PRE-SURGERY ENSURE that was given to before you leave your house the morning of surgery.  Please, if able, drink it in one setting. DO NOT SIP.   Do not wear jewelry, make-up or nail polish.  Do not wear lotions, powders, perfumes, or deodorant.  Do not shave 48 hours prior to surgery.   Do not bring valuables to the hospital.  [facility] is not responsible for any belongings or valuables.  Contacts, dentures or bridgework may not be worn into surgery.  Leave your suitcase in the car.  After surgery it may be brought to your room.  For patients admitted to the hospital, discharge time will be determined by your treatment team.  Patients discharged the day of surgery will not be allowed to drive home.   Special instructions:    [facility]- Preparing For Surgery  Before surgery, you can play an important role. Because skin is not sterile, your skin needs to be as free of germs as possible. You can reduce the number of germs on your skin by washing with CHG (chlorahexidine gluconate) Soap before surgery.  CHG is an antiseptic cleaner which kills germs and bonds with the skin to continue killing germs even after  washing.  Please do not use if you have an allergy to CHG or antibacterial soaps. If your skin becomes reddened/irritated stop using the CHG.  Do not shave (including legs and underarms) for at least 48 hours prior to first CHG shower. It is OK to shave your face.  Please follow these instructions carefully.   1. Shower the NIGHT BEFORE SURGERY and the MORNING OF SURGERY with CHG.   2. If you chose to wash your hair, wash your hair first as usual with your normal shampoo.  3. After you shampoo, rinse your hair and body thoroughly to remove the shampoo.  4. Use CHG as you would any other liquid soap. You can apply CHG directly to the skin and wash gently with a scrungie or a clean washcloth.   5. Apply the CHG Soap to your body ONLY FROM THE NECK DOWN.  Do not use on open wounds or open sores. Avoid contact with your eyes, ears, mouth and genitals (private parts). Wash Face and genitals (private parts)  with your normal soap.  6. Wash thoroughly, paying special attention to the area where your surgery will be performed.  7. Thoroughly rinse your body with warm water from the neck down.  8. DO NOT shower/wash with your normal soap after using and rinsing off the CHG Soap.  9. Pat yourself dry with a CLEAN TOWEL.  10. Wear CLEAN PAJAMAS to bed the night before surgery, wear comfortable clothes  the morning of surgery  11. Place CLEAN SHEETS on your bed the night of your first shower and DO NOT SLEEP WITH PETS.  Day of Surgery: Do not apply any deodorants/lotions. Please wear clean clothes to the hospital/surgery center.    Please read over the following fact sheets that you were given. Pain Booklet, Coughing and Deep Breathing and Surgical Site Infection Prevention

## 2016-12-23 ENCOUNTER — Encounter (HOSPITAL_COMMUNITY)
Admission: RE | Admit: 2016-12-23 | Discharge: 2016-12-23 | Disposition: A | Discharge status: Home / Self Care | Payer: BLUE CROSS/BLUE SHIELD | Source: Ambulatory Visit | Attending: Surgery | Admitting: Surgery

## 2016-12-23 ENCOUNTER — Encounter (HOSPITAL_COMMUNITY): Payer: Self-pay

## 2016-12-23 ENCOUNTER — Other Ambulatory Visit: Payer: Self-pay

## 2016-12-23 DIAGNOSIS — Z8349 Family history of other endocrine, nutritional and metabolic diseases: Secondary | ICD-10-CM | POA: Diagnosis not present

## 2016-12-23 DIAGNOSIS — Z87891 Personal history of nicotine dependence: Secondary | ICD-10-CM | POA: Diagnosis not present

## 2016-12-23 DIAGNOSIS — Z836 Family history of other diseases of the respiratory system: Secondary | ICD-10-CM | POA: Diagnosis not present

## 2016-12-23 DIAGNOSIS — Z170 Estrogen receptor positive status [ER+]: Secondary | ICD-10-CM | POA: Diagnosis not present

## 2016-12-23 DIAGNOSIS — C50411 Malignant neoplasm of upper-outer quadrant of right female breast: Secondary | ICD-10-CM | POA: Diagnosis present

## 2016-12-23 DIAGNOSIS — Z8261 Family history of arthritis: Secondary | ICD-10-CM | POA: Diagnosis not present

## 2016-12-23 DIAGNOSIS — Z86010 Personal history of colonic polyps: Secondary | ICD-10-CM | POA: Diagnosis not present

## 2016-12-23 DIAGNOSIS — Z811 Family history of alcohol abuse and dependence: Secondary | ICD-10-CM | POA: Diagnosis not present

## 2016-12-23 DIAGNOSIS — Z9889 Other specified postprocedural states: Secondary | ICD-10-CM | POA: Diagnosis not present

## 2016-12-23 DIAGNOSIS — D0511 Intraductal carcinoma in situ of right breast: Secondary | ICD-10-CM | POA: Diagnosis not present

## 2016-12-23 HISTORY — DX: Other specified postprocedural states: R11.2

## 2016-12-23 HISTORY — DX: Palpitations: R00.2

## 2016-12-23 HISTORY — DX: Other specified postprocedural states: Z98.890

## 2016-12-23 HISTORY — DX: Adverse effect of unspecified anesthetic, initial encounter: T41.45XA

## 2016-12-23 HISTORY — DX: Other complications of anesthesia, initial encounter: T88.59XA

## 2016-12-23 LAB — CBC
HEMATOCRIT: 35.8 % — AB (ref 36.0–46.0)
HEMOGLOBIN: 11.8 g/dL — AB (ref 12.0–15.0)
MCH: 29.8 pg (ref 26.0–34.0)
MCHC: 33 g/dL (ref 30.0–36.0)
MCV: 90.4 fL (ref 78.0–100.0)
Platelets: 249 10*3/uL (ref 150–400)
RBC: 3.96 MIL/uL (ref 3.87–5.11)
RDW: 14.8 % (ref 11.5–15.5)
WBC: 8.9 10*3/uL (ref 4.0–10.5)

## 2016-12-23 LAB — BASIC METABOLIC PANEL
Anion gap: 7 (ref 5–15)
BUN: 12 mg/dL (ref 6–20)
CALCIUM: 9.2 mg/dL (ref 8.9–10.3)
CHLORIDE: 108 mmol/L (ref 101–111)
CO2: 23 mmol/L (ref 22–32)
CREATININE: 0.74 mg/dL (ref 0.44–1.00)
GFR calc non Af Amer: >60 mL/min (ref >60)
GLUCOSE: 154 mg/dL — AB (ref 65–99)
Potassium: 4 mmol/L (ref 3.5–5.1)
Sodium: 138 mmol/L (ref 135–145)

## 2016-12-23 NOTE — Progress Notes (Signed)
PCP/GYN - Dr. Corinna Capra  Cardiologist - Denies  Chest x-ray - 03/05/16 (E)  EKG - 03/05/16 (E)  Stress Test - 8 yrs ago  ECHO - 8 yrs ago  Cardiac Cath - Denies  Sleep Study - Yes-Negative CPAP - None    Pt denies having chest pain, sob, or fever at this time. All instructions explained to the pt, with a verbal understanding of the material. Pt agrees to go over the instructions while at home for a better understanding. The opportunity to ask questions was provided.

## 2016-12-25 NOTE — H&P (Signed)
Rhonda Cooley  Location: [facility] Patient #: [id number] DOB: 28-Apr-1955 Married / Language: English / Race: White Female  History of Present Illness   The patient is a 61 year old female who presents with a complaint of right breast cancer.  The PCP is Dr. Daron Offer.   She saw Dr. Candace Gallus until her retired at [employer]. She goes there for colds.  The patient was referred by Dr. Domenick Bookbinder.  She saw Dr. Burr Medico.  She comes by herself.  The patient had not had a mammogram and a couple years. She noticed nothing new in her breast. She underwent a mammogram/US on 11/25/2016 which showed Ultrasound targeted to the right breast at 11:30, 4 cm from the nipple demonstrates an irregular hypoechoic mass measuring 1.3 x 1.0 x 1.1 cm. She underwent a right breast biopsy on 12/02/2016 [phone]) at 11:30 o'clock - IDC, Grade 1, Receptor Status: ER(100%+), PR(0%+,) Her2-neu (neg ratio 1.0), Ki-(5%)  She has no family history of breast cancer. Her last period was about 8 years ago. She is not on hormone replacement medication.  I discussed the options for breast cancer treatment with the patient. I discussed a multidisciplinary approach to the treatment of breast cancer, which includes medical oncology and radiation oncology. I discussed the surgical options of lumpectomy vs. mastectomy. If mastectomy, there is the possibility of reconstruction. I discussed the options of lymph node biopsy. The treatment plan depends on the pathologic staging of the tumor and the patient's personal wishes. The risks of surgery include, but are not limited to, bleeding, infection, the need for further surgery, and nerve injury. The patient has been given literature on the treatment of breast cancer. I gave her and [facility] bag and her pathology report.  Past Medical History: 1. endometriosis exploration - 1985 2. back surgery - Dr. Sherwood Gambler -  2013 Doing well from this 3. Colonosocpy - 09/16/2016 - [facility] Multiple polyps - repeat in 3 years 4. Endometrial lesion - benign - 09/20/2016  Social History: Married. 3 children: 12 yo son (adopted), 77 yo son, and 63 yo daughter. 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 Colon Polyp Removal - Colonoscopy  Tonsillectomy   Diagnostic Studies History ([name], CMA; 12/06/2016 1:37 PM) Colonoscopy  within last year Mammogram  within last year  Allergies ([name], CMA; 12/06/2016 1:38 PM) No Known Drug Allergies 12/06/2016  Medication History ([name], [place]; 12/06/2016 1:38 PM) No Current Medications Medications Reconciled  Social History ([name], CMA; 12/06/2016 1:37 PM) Caffeine use  Tea. No alcohol use  No drug use  Tobacco use  Never smoker.  Family History ([name], [place]; 12/06/2016 1:37 PM) Alcohol Abuse  Brother, Daughter. Arthritis  Father. Respiratory Condition  Father. Thyroid problems  Sister.  Pregnancy / Birth History ([name], [place]; 12/06/2016 1:37 PM) Age at menarche  76 years. Age of menopause  63-55 Gravida  2 Irregular periods  Length (months) of breastfeeding  3-6 Maternal age  50-25 Para  2  Other Problems ([name], CMA; 12/06/2016 1:37 PM) Breast Cancer  Cholelithiasis     Review of Systems ([name] CMA; 12/06/2016 1:37 PM) General Not Present- Appetite Loss, Chills, Fatigue, Fever, Night Sweats, Weight Gain and Weight Loss. Skin Not Present- Change in Wart/Mole, Dryness, Hives, Jaundice, New Lesions, Non-Healing Wounds, Rash and Ulcer. HEENT Not Present- Earache,  Hearing Loss, Hoarseness, Nose Bleed, Oral Ulcers, Ringing in the Ears, Seasonal Allergies, Sinus Pain, Sore Throat, Visual Disturbances, Wears glasses/contact lenses and Yellow  Eyes. Respiratory Not Present- Bloody sputum, Chronic Cough, Difficulty Breathing, Snoring and Wheezing. Breast Present- Breast Mass. Not Present- Breast Pain, Nipple Discharge and Skin Changes. Cardiovascular Not Present- Chest Pain, Difficulty Breathing Lying Down, Leg Cramps, Palpitations, Rapid Heart Rate, Shortness of Breath and Swelling of Extremities. Gastrointestinal Not Present- Abdominal Pain, Bloating, Bloody Stool, Change in Bowel Habits, Chronic diarrhea, Constipation, Difficulty Swallowing, Excessive gas, Gets full quickly at meals, Hemorrhoids, Indigestion, Nausea, Rectal Pain and Vomiting. Female Genitourinary Not Present- Frequency, Nocturia, Painful Urination, Pelvic Pain and Urgency. Musculoskeletal Not Present- Back Pain, Joint Pain, Joint Stiffness, Muscle Pain, Muscle Weakness and Swelling of Extremities. Neurological Not Present- Decreased Memory, Fainting, Headaches, Numbness, Seizures, Tingling, Tremor, Trouble walking and Weakness. Psychiatric Not Present- Anxiety, Bipolar, Change in Sleep Pattern, Depression, Fearful and Frequent crying. Endocrine Not Present- Cold Intolerance, Excessive Hunger, Hair Changes, Heat Intolerance, Hot flashes and New Diabetes. Hematology Not Present- Blood Thinners, Easy Bruising, Excessive bleeding, Gland problems, HIV and Persistent Infections.  Vitals ([name] CMA; 12/06/2016 1:38 PM) 12/06/2016 1:38 PM Weight: 211.38 lb Height: 64in Body Surface Area: 2 m Body Mass Index: 36.28 kg/m  Temp.: 98.41F(Oral)  Pulse: 96 (Regular)  BP: 150/90 (Sitting, Left Arm, Standard)   Physical Exam  General: Obese WFalert and generally healthy appearing. Pleasant disposition. HEENT: Normal. Pupils equal.  Neck: Supple. No mass. No thyroid mass.  Lymph Nodes: No supraclavicular or cervical nodes.  Lungs: Clear to auscultation and symmetric breath sounds. Heart: RRR. No murmur or rub.  Breasts: Right - bandaid at 9  o'clock. I cannot feel a specific mass, though some possible fullness in the upper breast.  Left - No mass or nodule.  Abdomen: Soft. No mass. No tenderness. No hernia. Normal bowel sounds. Pfannenstiel incision. Rectal: Not done.  Extremities: Good strength and ROM in upper and lower extremities.  Neurologic: Grossly intact to motor and sensory function. Psychiatric: Has normal mood and affect. Behavior is normal.  Assessment & Plan  1.  MALIGNANT NEOPLASM OF RIGHT BREAST, STAGE 1, UNSPECIFIED ESTROGEN RECEPTOR STATUS (C50.911)  Story: Right breast biopsy on 12/02/2016 ([id number]) at 11:30 o'clock - IDC, Grade 1, Receptor Status: ER(100%+), PR(0%+,) Her2-neu (neg ratio 1.0), Ki-(5%)  Plan:  1) Consult with medical and radiation oncology   She has seen Drs. Burr Medico and [place]  2) Plan right breast lumpectomy (seed localization) and right axillary sentnel lymph node biopsy   2. Gallstones, asymptomatic  Addendum Note(Jeslynn Hollander H. Kriya Westra MD; 12/06/2016 2:37 PM) She had gall baldder disease - I saw her in around 2012 for this, but she did not have surgery - [place] report in Epic (06/02/2010). She has done well since then without symptoms.  3. endometriosis exploration - 1985 4. back surgery - Dr. Sherwood Gambler - 2013 Doing well from this 5. Colonosocpy - 09/16/2016 - [facility] Multiple polyps - repeat in 3 years 6. Endometrial lesion - benign - 09/20/2016   Alphonsa Overall, MD, [facility] Pager: [phone] Office phone:  [phone]

## 2016-12-26 ENCOUNTER — Other Ambulatory Visit: Payer: Self-pay | Admitting: Surgery

## 2016-12-26 ENCOUNTER — Encounter (HOSPITAL_COMMUNITY)
Admission: RE | Disposition: A | Discharge status: Home / Self Care | Payer: Self-pay | Source: Ambulatory Visit | Attending: Surgery

## 2016-12-26 ENCOUNTER — Inpatient Hospital Stay: Admission: RE | Admit: 2016-12-26 | Payer: BLUE CROSS/BLUE SHIELD | Source: Ambulatory Visit

## 2016-12-26 ENCOUNTER — Ambulatory Visit
Admission: RE | Admit: 2016-12-26 | Discharge: 2016-12-26 | Disposition: A | Discharge status: Home / Self Care | Payer: BLUE CROSS/BLUE SHIELD | Source: Ambulatory Visit | Attending: Surgery | Admitting: Surgery

## 2016-12-26 ENCOUNTER — Ambulatory Visit (HOSPITAL_COMMUNITY): Payer: BLUE CROSS/BLUE SHIELD | Admitting: Anesthesiology

## 2016-12-26 ENCOUNTER — Ambulatory Visit (HOSPITAL_COMMUNITY)
Admission: RE | Admit: 2016-12-26 | Discharge: 2016-12-26 | Disposition: A | Discharge status: Home / Self Care | Payer: BLUE CROSS/BLUE SHIELD | Source: Ambulatory Visit | Attending: Surgery | Admitting: Surgery

## 2016-12-26 ENCOUNTER — Inpatient Hospital Stay (HOSPITAL_COMMUNITY): Admission: RE | Admit: 2016-12-26 | Payer: BLUE CROSS/BLUE SHIELD | Source: Ambulatory Visit

## 2016-12-26 ENCOUNTER — Ambulatory Visit
Admission: RE | Admit: 2016-12-26 | Discharge: 2016-12-26 | Disposition: A | Discharge status: Home / Self Care | Payer: BLUE CROSS/BLUE SHIELD | Source: Ambulatory Visit

## 2016-12-26 DIAGNOSIS — Z86010 Personal history of colonic polyps: Secondary | ICD-10-CM | POA: Insufficient documentation

## 2016-12-26 DIAGNOSIS — D0511 Intraductal carcinoma in situ of right breast: Secondary | ICD-10-CM | POA: Insufficient documentation

## 2016-12-26 DIAGNOSIS — Z170 Estrogen receptor positive status [ER+]: Principal | ICD-10-CM

## 2016-12-26 DIAGNOSIS — Z8261 Family history of arthritis: Secondary | ICD-10-CM | POA: Insufficient documentation

## 2016-12-26 DIAGNOSIS — C50911 Malignant neoplasm of unspecified site of right female breast: Secondary | ICD-10-CM

## 2016-12-26 DIAGNOSIS — Z9889 Other specified postprocedural states: Secondary | ICD-10-CM | POA: Insufficient documentation

## 2016-12-26 DIAGNOSIS — Z87891 Personal history of nicotine dependence: Secondary | ICD-10-CM | POA: Insufficient documentation

## 2016-12-26 DIAGNOSIS — Z811 Family history of alcohol abuse and dependence: Secondary | ICD-10-CM | POA: Insufficient documentation

## 2016-12-26 DIAGNOSIS — Z836 Family history of other diseases of the respiratory system: Secondary | ICD-10-CM | POA: Insufficient documentation

## 2016-12-26 DIAGNOSIS — Z8349 Family history of other endocrine, nutritional and metabolic diseases: Secondary | ICD-10-CM | POA: Insufficient documentation

## 2016-12-26 HISTORY — PX: BREAST LUMPECTOMY WITH RADIOACTIVE SEED AND SENTINEL LYMPH NODE BIOPSY: SHX6550

## 2016-12-26 SURGERY — BREAST LUMPECTOMY WITH RADIOACTIVE SEED AND SENTINEL LYMPH NODE BIOPSY
Anesthesia: General | Site: Breast | Laterality: Right

## 2016-12-26 MED ORDER — ONDANSETRON HCL 4 MG/2ML IJ SOLN
INTRAMUSCULAR | Status: AC
  Filled 2016-12-26: qty 2

## 2016-12-26 MED ORDER — PHENYLEPHRINE 40 MCG/ML (10ML) SYRINGE FOR IV PUSH (FOR BLOOD PRESSURE SUPPORT)
PREFILLED_SYRINGE | INTRAVENOUS | Status: DC | PRN
  Administered 2016-12-26 (×5): 80 ug via INTRAVENOUS

## 2016-12-26 MED ORDER — FENTANYL CITRATE (PF) 250 MCG/5ML IJ SOLN
INTRAMUSCULAR | Status: AC
  Filled 2016-12-26: qty 5

## 2016-12-26 MED ORDER — MIDAZOLAM HCL 5 MG/5ML IJ SOLN
INTRAMUSCULAR | Status: DC | PRN
  Administered 2016-12-26: 2 mg via INTRAVENOUS

## 2016-12-26 MED ORDER — MIDAZOLAM HCL 2 MG/2ML IJ SOLN
INTRAMUSCULAR | Status: AC
  Administered 2016-12-26: 2 mg
  Filled 2016-12-26: qty 2

## 2016-12-26 MED ORDER — 0.9 % SODIUM CHLORIDE (POUR BTL) OPTIME
TOPICAL | Status: DC | PRN
  Administered 2016-12-26: 1000 mL

## 2016-12-26 MED ORDER — ACETAMINOPHEN 500 MG PO TABS
1000.0000 mg | ORAL_TABLET | ORAL | Status: AC
  Administered 2016-12-26: 1000 mg via ORAL

## 2016-12-26 MED ORDER — SCOPOLAMINE 1 MG/3DAYS TD PT72
MEDICATED_PATCH | TRANSDERMAL | Status: DC | PRN
  Administered 2016-12-26: 1 via TRANSDERMAL

## 2016-12-26 MED ORDER — LIDOCAINE 2% (20 MG/ML) 5 ML SYRINGE
INTRAMUSCULAR | Status: AC
  Filled 2016-12-26: qty 5

## 2016-12-26 MED ORDER — BUPIVACAINE-EPINEPHRINE (PF) 0.5% -1:200000 IJ SOLN
INTRAMUSCULAR | Status: DC | PRN
  Administered 2016-12-26: 30 mL

## 2016-12-26 MED ORDER — BUPIVACAINE-EPINEPHRINE (PF) 0.25% -1:200000 IJ SOLN
INTRAMUSCULAR | Status: AC
  Filled 2016-12-26: qty 30

## 2016-12-26 MED ORDER — PROPOFOL 1000 MG/100ML IV EMUL
INTRAVENOUS | Status: AC
  Filled 2016-12-26: qty 100

## 2016-12-26 MED ORDER — CHLORHEXIDINE GLUCONATE CLOTH 2 % EX PADS: 6.0000 | MEDICATED_PAD | Freq: Once | CUTANEOUS | Status: DC

## 2016-12-26 MED ORDER — FENTANYL CITRATE (PF) 250 MCG/5ML IJ SOLN
INTRAMUSCULAR | Status: DC | PRN
  Administered 2016-12-26: 50 ug via INTRAVENOUS

## 2016-12-26 MED ORDER — FENTANYL CITRATE (PF) 100 MCG/2ML IJ SOLN
INTRAMUSCULAR | Status: AC
  Administered 2016-12-26: 50 ug
  Filled 2016-12-26: qty 2

## 2016-12-26 MED ORDER — TECHNETIUM TC 99M SULFUR COLLOID FILTERED
1.0000 | Freq: Once | INTRAVENOUS | Status: AC | PRN
  Administered 2016-12-26: 1 via INTRADERMAL

## 2016-12-26 MED ORDER — SODIUM CHLORIDE 0.9 % IJ SOLN
INTRAMUSCULAR | Status: AC
  Filled 2016-12-26: qty 10

## 2016-12-26 MED ORDER — ROCURONIUM BROMIDE 10 MG/ML (PF) SYRINGE
PREFILLED_SYRINGE | INTRAVENOUS | Status: AC
  Filled 2016-12-26: qty 5

## 2016-12-26 MED ORDER — MIDAZOLAM HCL 2 MG/2ML IJ SOLN
INTRAMUSCULAR | Status: AC
  Filled 2016-12-26: qty 2

## 2016-12-26 MED ORDER — LACTATED RINGERS IV SOLN
INTRAVENOUS | Status: DC
  Administered 2016-12-26 (×2): via INTRAVENOUS

## 2016-12-26 MED ORDER — PROPOFOL 10 MG/ML IV BOLUS
INTRAVENOUS | Status: DC | PRN
  Administered 2016-12-26: 150 mg via INTRAVENOUS

## 2016-12-26 MED ORDER — ONDANSETRON HCL 4 MG/2ML IJ SOLN
INTRAMUSCULAR | Status: DC | PRN
  Administered 2016-12-26: 4 mg via INTRAVENOUS

## 2016-12-26 MED ORDER — CEFAZOLIN SODIUM-DEXTROSE 2-4 GM/100ML-% IV SOLN
2.0000 g | INTRAVENOUS | Status: AC
  Administered 2016-12-26: 2 g via INTRAVENOUS

## 2016-12-26 MED ORDER — PROPOFOL 10 MG/ML IV BOLUS
INTRAVENOUS | Status: AC
  Filled 2016-12-26: qty 20

## 2016-12-26 MED ORDER — CEFAZOLIN SODIUM-DEXTROSE 2-4 GM/100ML-% IV SOLN
INTRAVENOUS | Status: AC
  Filled 2016-12-26: qty 100

## 2016-12-26 MED ORDER — GABAPENTIN 300 MG PO CAPS
ORAL_CAPSULE | ORAL | Status: AC
  Filled 2016-12-26: qty 1

## 2016-12-26 MED ORDER — BUPIVACAINE-EPINEPHRINE 0.25% -1:200000 IJ SOLN
INTRAMUSCULAR | Status: DC | PRN
  Administered 2016-12-26: 20 mL

## 2016-12-26 MED ORDER — METHYLENE BLUE 0.5 % INJ SOLN
INTRAVENOUS | Status: AC
  Filled 2016-12-26: qty 10

## 2016-12-26 MED ORDER — PROPOFOL 500 MG/50ML IV EMUL
INTRAVENOUS | Status: DC | PRN
  Administered 2016-12-26: 25 ug/kg/min via INTRAVENOUS

## 2016-12-26 MED ORDER — LIDOCAINE 2% (20 MG/ML) 5 ML SYRINGE
INTRAMUSCULAR | Status: DC | PRN
  Administered 2016-12-26: 60 mg via INTRAVENOUS

## 2016-12-26 MED ORDER — ACETAMINOPHEN 500 MG PO TABS
ORAL_TABLET | ORAL | Status: AC
  Filled 2016-12-26: qty 2

## 2016-12-26 MED ORDER — DEXAMETHASONE SODIUM PHOSPHATE 10 MG/ML IJ SOLN
INTRAMUSCULAR | Status: AC
  Filled 2016-12-26: qty 1

## 2016-12-26 MED ORDER — SCOPOLAMINE 1 MG/3DAYS TD PT72
MEDICATED_PATCH | TRANSDERMAL | Status: AC
  Filled 2016-12-26: qty 1

## 2016-12-26 MED ORDER — PHENYLEPHRINE 40 MCG/ML (10ML) SYRINGE FOR IV PUSH (FOR BLOOD PRESSURE SUPPORT)
PREFILLED_SYRINGE | INTRAVENOUS | Status: AC
  Filled 2016-12-26: qty 10

## 2016-12-26 MED ORDER — HYDROCODONE-ACETAMINOPHEN 5-325 MG PO TABS: 1.0000 | ORAL_TABLET | Freq: Four times a day (QID) | ORAL | 0 refills | Status: DC | PRN

## 2016-12-26 MED ORDER — GABAPENTIN 300 MG PO CAPS
300.0000 mg | ORAL_CAPSULE | ORAL | Status: AC
  Administered 2016-12-26: 300 mg via ORAL

## 2016-12-26 SURGICAL SUPPLY — 48 items
ADH SKN CLS APL DERMABOND .7 (GAUZE/BANDAGES/DRESSINGS) ×1
BINDER BREAST LRG (GAUZE/BANDAGES/DRESSINGS) IMPLANT
BINDER BREAST XLRG (GAUZE/BANDAGES/DRESSINGS) IMPLANT
BINDER BREAST XXLRG (GAUZE/BANDAGES/DRESSINGS) ×2 IMPLANT
BLADE SURG 15 STRL LF DISP TIS (BLADE) ×1 IMPLANT
BLADE SURG 15 STRL SS (BLADE) ×6
CANISTER SUCT 3000ML PPV (MISCELLANEOUS) ×3 IMPLANT
CHLORAPREP W/TINT 26ML (MISCELLANEOUS) ×3 IMPLANT
CLIP VESOCCLUDE SM WIDE 6/CT (CLIP) ×3 IMPLANT
COVER PROBE W GEL 5X96 (DRAPES) ×3 IMPLANT
COVER SURGICAL LIGHT HANDLE (MISCELLANEOUS) ×3 IMPLANT
DERMABOND ADVANCED (GAUZE/BANDAGES/DRESSINGS) ×2
DERMABOND ADVANCED .7 DNX12 (GAUZE/BANDAGES/DRESSINGS) ×1 IMPLANT
DEVICE DUBIN SPECIMEN MAMMOGRA (MISCELLANEOUS) ×3 IMPLANT
DRAPE CHEST BREAST 15X10 FENES (DRAPES) ×3 IMPLANT
DRAPE UTILITY XL STRL (DRAPES) ×3 IMPLANT
ELECT COATED BLADE 2.86 ST (ELECTRODE) ×3 IMPLANT
ELECT REM PT RETURN 9FT ADLT (ELECTROSURGICAL) ×3
ELECTRODE REM PT RTRN 9FT ADLT (ELECTROSURGICAL) ×1 IMPLANT
GAUZE SPONGE 4X4 12PLY STRL (GAUZE/BANDAGES/DRESSINGS) ×3 IMPLANT
GAUZE SPONGE 4X4 12PLY STRL LF (GAUZE/BANDAGES/DRESSINGS) ×2 IMPLANT
GLOVE SURG SIGNA 7.5 PF LTX (GLOVE) ×6 IMPLANT
GOWN STRL REUS W/ TWL LRG LVL3 (GOWN DISPOSABLE) ×1 IMPLANT
GOWN STRL REUS W/ TWL XL LVL3 (GOWN DISPOSABLE) ×1 IMPLANT
GOWN STRL REUS W/TWL LRG LVL3 (GOWN DISPOSABLE) ×3
GOWN STRL REUS W/TWL XL LVL3 (GOWN DISPOSABLE) ×3
KIT BASIN OR (CUSTOM PROCEDURE TRAY) ×3 IMPLANT
KIT MARKER MARGIN INK (KITS) ×3 IMPLANT
NDL FILTER BLUNT 18X1 1/2 (NEEDLE) IMPLANT
NDL HYPO 25GX1X1/2 BEV (NEEDLE) ×1 IMPLANT
NDL SAFETY ECLIPSE 18X1.5 (NEEDLE) IMPLANT
NEEDLE FILTER BLUNT 18X 1/2SAF (NEEDLE)
NEEDLE FILTER BLUNT 18X1 1/2 (NEEDLE) IMPLANT
NEEDLE HYPO 18GX1.5 SHARP (NEEDLE)
NEEDLE HYPO 25GX1X1/2 BEV (NEEDLE) ×3 IMPLANT
NS IRRIG 1000ML POUR BTL (IV SOLUTION) ×3 IMPLANT
PACK SURGICAL SETUP 50X90 (CUSTOM PROCEDURE TRAY) ×3 IMPLANT
PENCIL BUTTON HOLSTER BLD 10FT (ELECTRODE) ×3 IMPLANT
SPONGE LAP 18X18 X RAY DECT (DISPOSABLE) ×3 IMPLANT
SUT MNCRL AB 4-0 PS2 18 (SUTURE) ×3 IMPLANT
SUT VIC AB 3-0 SH 8-18 (SUTURE) ×5 IMPLANT
SYR BULB 3OZ (MISCELLANEOUS) ×3 IMPLANT
SYR CONTROL 10ML LL (SYRINGE) ×3 IMPLANT
TOWEL OR 17X24 6PK STRL BLUE (TOWEL DISPOSABLE) ×3 IMPLANT
TOWEL OR 17X26 10 PK STRL BLUE (TOWEL DISPOSABLE) ×3 IMPLANT
TUBE CONNECTING 12'X1/4 (SUCTIONS) ×1
TUBE CONNECTING 12X1/4 (SUCTIONS) ×2 IMPLANT
YANKAUER SUCT BULB TIP NO VENT (SUCTIONS) ×3 IMPLANT

## 2016-12-26 NOTE — Discharge Instructions (Signed)
[facility] - DISCHARGE INSTRUCTIONS TO PATIENT  Activity:  Driving - May drive in 2 or 3 days, if doing well.   Lifting - No lifting more than 15 pounds for 1 week, then no limit  Wound Care:   Leave bandage on for 2 days.  Then you may remove the bandage and shower.  Diet:  As tolerated.  Follow up appointment:  Call Dr. Pollie Friar office [facility]) at [phone] for an appointment in 2 - 3 weeks.  Medications and dosages:  Resume your home medications.  You have a prescription for:  vicodin  Call Dr. Lucia Gaskins or his office  [phone]) if you have:  Temperature greater than 100.4,  Persistent nausea and vomiting,  Severe uncontrolled pain,  Redness, tenderness, or signs of infection (pain, swelling, redness, odor or green/yellow discharge around the site),  Difficulty breathing, headache or visual disturbances,  Any other questions or concerns you may have after discharge.  In an emergency, call 911 or go to an Emergency Department at a nearby hospital.

## 2016-12-26 NOTE — Anesthesia Procedure Notes (Signed)
Anesthesia Regional Block: Pectoralis block   Pre-Anesthetic Checklist: ,, timeout performed, Correct Patient, Correct Site, Correct Laterality, Correct Procedure, Correct Position, site marked, Risks and benefits discussed,  Surgical consent,  Pre-op evaluation,  At surgeon's request and post-op pain management  Laterality: Right  Prep: chloraprep       Needles:   Needle Type: Stimiplex     Needle Length: 9cm      Additional Needles:   Procedures:,,,, ultrasound used (permanent image in chart),,,,  Narrative:  Start time: 12/26/2016 11:36 AM End time: 12/26/2016 11:40 AM Injection made incrementally with aspirations every 5 mL.  Performed by: Personally  Anesthesiologist: Nolon Nations, MD  Additional Notes: Patient tolerated well. Good fascial spread noted.

## 2016-12-26 NOTE — Anesthesia Postprocedure Evaluation (Signed)
Anesthesia Post Note  Patient: Rhonda Cooley  Procedure(s) Performed: BREAST LUMPECTOMY WITH RADIOACTIVE SEED AND RIGHT AXILLARY SENTINEL LYMPH NODE BIOPSY (Right Breast)     Patient location during evaluation: PACU Anesthesia Type: General Level of consciousness: awake Pain management: pain level controlled Vital Signs Assessment: post-procedure vital signs reviewed and stable Respiratory status: spontaneous breathing Cardiovascular status: stable Anesthetic complications: no    Last Vitals:  Vitals:   12/26/16 1430 12/26/16 1444  BP: (!) 147/84 (!) 157/93  Pulse: 74 78  Resp: 13 14  Temp: 36.7 C   SpO2: 100% 100%    Last Pain:  Vitals:   12/26/16 1444  TempSrc:   PainSc: 0-No pain                 Zelda Reames

## 2016-12-26 NOTE — Op Note (Signed)
12/26/2016  2:03 PM  PATIENT:  Rhonda Cooley DOB: 06/03/1955 MRN: [id number]  PREOP DIAGNOSIS:   RIGHT BREAST CANCER  POSTOP DIAGNOSIS:    Right breast cancer, 11 o'clock position (T1, N0)  PROCEDURE:   Procedure(s): BREAST LUMPECTOMY WITH RADIOACTIVE SEED AND RIGHT AXILLARY SENTINEL LYMPH NODE BIOPSY, deep sentinel lymph node biopsy  SURGEON:   Alphonsa Overall, M.D.  ANESTHESIA:   general  Anesthesiologist: Catalina Gravel, MD CRNA: Freddie Breech, CRNA  General  EBL:  minimal  ml  DRAINS:  none   LOCAL MEDICATIONS USED:   20 cc of 1/4% marcaine, right pectoral block by anesthesia  SPECIMEN:   Right breast lumpectomy (6 color paint), superior margin, right axillary lymph node (counts 650, background 10)  COUNTS CORRECT:  YES  INDICATIONS FOR PROCEDURE:  Rhonda Cooley is a 61 y.o. (DOB: 05/13/1955) white female whose primary care physician is [facility] [place] [facility] and comes for right breast lumpectomy and right axillary sentinel lymph node biopsy.   The options for breast cancer treatment have been discussed with the patient. She elected to proceed with lumpectomy and axillary sentinel lymph node.   She has seen Drs. Burr Medico and [place] in consultation.    The indications and potential complications of surgery were explained to the patient. Potential complications include, but are not limited to, bleeding, infection, the need for further surgery, and nerve injury.     She had a I131 seed placed on 12/26/2016 in her right breast at [facility].    The seed is in the 11 o'clock position of the right breast.   In the holding area, her right areola was injected with 1 millicurie of Technitium Sulfur Colloid.  OPERATIVE NOTE:   The patient was taken to room # 9 at [facility] where she underwent a general anesthesia  supervised by Anesthesiologist: Catalina Gravel, MD CRNA: Freddie Breech, CRNA. Her right breast and axilla were  prepped with  ChloraPrep and sterilely draped.    A time-out and the surgical check list was reviewed.    I turned attention to the cancer which was about at the 11 o'clock position of the right breast.   I used the Neoprobe to identify the I131 seed.  I tried to excise an area around the tumor of at least 1 cm.    I excised this block of breast tissue approximately 4 cm by 6 cm  in diameter.   I painted the lumpectomy specimen with the 6 color paint kit and did a specimen mammogram which confirmed the mass, clip, and the seed were all in the right position in the specimen.  The specimen was sent to pathology who called back to confirm that they have the seed and the specimen.   I thought that the tumor was close to the superior margin, so I took an additional superior margin.   I then started the right deep axillary sentinel lymph node biopsy. I made an incision in the right axilla.  I found a hot area at the junction of the breast and the pectoralis major muscle, deep in the axilla. I cut down and  identified a hot node that had counts of 650 and the background has 10 counts.  I checked her internal mammary nodes and supraclavicular nodes with the neoprobe and found no other hot area. The axillary node was then sent to pathology.    I then irrigated the wound with saline. I infiltrated approximately 20  mL of 1/4% Marcaine between the incisions. She had had a pectoral blockI placed 4 clips to mark biopsy cavity, at 12, 3, 6, and 9 o'clock.  I then closed all the wounds in layers using 3-0 Vicryl sutures for the deep layer. At the skin, I closed the incisions with a 4-0 Monocryl suture. The incisions were then painted with Dermabond.  She had gauze place over the wounds and placed in a breast binder.   The patient tolerated the procedure well, was transported to the recovery room in good condition. Sponge and needle count were correct at the end of the case.   Final pathology is pending.   Alphonsa Overall, MD, [facility] Pager: [phone] Office phone:  [phone]

## 2016-12-26 NOTE — Interval H&P Note (Signed)
History and Physical Interval Note:  12/26/2016 12:18 PM  Rhonda Cooley  has presented today for surgery, with the diagnosis of RIGHT BREAST CANCER  The various methods of treatment have been discussed with the patient and family.   Daughter, Aldona Bar, at bedside.  After consideration of risks, benefits and other options for treatment, the patient has consented to  Procedure(s): BREAST LUMPECTOMY WITH RADIOACTIVE SEED AND RIGHT AXILLARY SENTINEL LYMPH NODE BIOPSY (Right) as a surgical intervention .  The patient's history has been reviewed, patient examined, no change in status, stable for surgery.  I have reviewed the patient's chart and labs.  Questions were answered to the patient's satisfaction.     Shann Medal

## 2016-12-26 NOTE — Anesthesia Procedure Notes (Signed)
Procedure Name: LMA Insertion Date/Time: 12/26/2016 12:30 PM Performed by: Freddie Breech, CRNA Pre-anesthesia Checklist: Patient identified, Emergency Drugs available, Suction available and Patient being monitored Patient Re-evaluated:Patient Re-evaluated prior to induction Oxygen Delivery Method: Circle System Utilized Preoxygenation: Pre-oxygenation with 100% oxygen Induction Type: IV induction Ventilation: Mask ventilation without difficulty LMA: LMA inserted LMA Size: 4.0 Number of attempts: 1 Airway Equipment and Method: Bite block Placement Confirmation: positive ETCO2 Tube secured with: Tape Dental Injury: Teeth and Oropharynx as per pre-operative assessment

## 2016-12-26 NOTE — Anesthesia Preprocedure Evaluation (Signed)
Anesthesia Evaluation  Patient identified by MRN, date of birth, ID band Patient awake    Reviewed: Allergy & Precautions, NPO status , Patient's Chart, lab work & pertinent test results  History of Anesthesia Complications (+) PONV and history of anesthetic complications  Airway Mallampati: II  TM Distance: >3 FB Neck ROM: Full    Dental no notable dental hx.    Pulmonary neg pulmonary ROS, former smoker,    Pulmonary exam normal breath sounds clear to auscultation       Cardiovascular negative cardio ROS Normal cardiovascular exam Rhythm:Regular Rate:Normal     Neuro/Psych  Headaches, PSYCHIATRIC DISORDERS Anxiety    GI/Hepatic negative GI ROS, Neg liver ROS,   Endo/Other  negative endocrine ROS  Renal/GU negative Renal ROS     Musculoskeletal negative musculoskeletal ROS (+)   Abdominal   Peds  Hematology negative hematology ROS (+)   Anesthesia Other Findings   Reproductive/Obstetrics negative OB ROS                             Anesthesia Physical Anesthesia Plan  ASA: II  Anesthesia Plan: General   Post-op Pain Management: GA combined w/ Regional for post-op pain   Induction: Intravenous  PONV Risk Score and Plan: 4 or greater and Ondansetron, Dexamethasone, Propofol infusion, Metaclopromide and Midazolam  Airway Management Planned: LMA  Additional Equipment:   Intra-op Plan:   Post-operative Plan: Extubation in OR  Informed Consent: I have reviewed the patients History and Physical, chart, labs and discussed the procedure including the risks, benefits and alternatives for the proposed anesthesia with the patient or authorized representative who has indicated his/her understanding and acceptance.   Dental advisory given  Plan Discussed with: CRNA  Anesthesia Plan Comments:         Anesthesia Quick Evaluation

## 2016-12-26 NOTE — Transfer of Care (Signed)
Immediate Anesthesia Transfer of Care Note  Patient: Rhonda Cooley  Procedure(s) Performed: BREAST LUMPECTOMY WITH RADIOACTIVE SEED AND RIGHT AXILLARY SENTINEL LYMPH NODE BIOPSY (Right Breast)  Patient Location: PACU  Anesthesia Type:GA combined with regional for post-op pain  Level of Consciousness: drowsy and patient cooperative  Airway & Oxygen Therapy: Patient Spontanous Breathing and Patient connected to face mask oxygen  Post-op Assessment: Report given to RN and Post -op Vital signs reviewed and stable  Post vital signs: Reviewed and stable  Last Vitals:  Vitals:   12/26/16 1022 12/26/16 1402  BP: 117/75 122/69  Pulse: 83 82  Resp: 19 12  Temp: 36.7 C 36.6 C  SpO2: 97% 100%    Last Pain:  Vitals:   12/26/16 1402  TempSrc:   PainSc: (P) 0-No pain         Complications: No apparent anesthesia complications

## 2016-12-27 ENCOUNTER — Encounter (HOSPITAL_COMMUNITY): Payer: Self-pay | Admitting: Surgery

## 2016-12-30 ENCOUNTER — Telehealth: Payer: Self-pay | Admitting: *Deleted

## 2016-12-30 NOTE — Telephone Encounter (Signed)
Ordered oncotype per Dr. Burr Medico.  Faxed requisition to pathology and confirmed receipt.  Faxed PA to [facility].

## 2017-01-09 ENCOUNTER — Ambulatory Visit (HOSPITAL_COMMUNITY): Payer: BLUE CROSS/BLUE SHIELD

## 2017-01-11 ENCOUNTER — Encounter (HOSPITAL_COMMUNITY): Payer: Self-pay

## 2017-01-12 ENCOUNTER — Telehealth: Payer: Self-pay | Admitting: *Deleted

## 2017-01-12 DIAGNOSIS — C50411 Malignant neoplasm of upper-outer quadrant of right female breast: Secondary | ICD-10-CM

## 2017-01-12 DIAGNOSIS — Z170 Estrogen receptor positive status [ER+]: Principal | ICD-10-CM

## 2017-01-12 NOTE — Telephone Encounter (Signed)
Received oncotype score of 9/6%. Physician team notified. Called pt to discuss results and next steps. Left vm requesting return call. Contact information provided.

## 2017-01-23 NOTE — Progress Notes (Signed)
F11/19/18:INAL DIAGNOSIS Follow up New Consult Right Breast  Diagnosis 12/26/16: Dr. Alphonsa Overall, MD 1. 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 Lymph node, sentinel, biopsy, right axillary ONE BENIGN LYMPH NODE (0/1) 3. Lymph node, sentinel, biopsy, right ONE BENIGN LYMPH NODE (0/1) 4. Lymph node, sentinel, biopsy, right ONE BENIGN LYMPH NODE (0/1) 5. Breast, excision, right additional superior margin BENIGN BREAST TISSUE NEGATIVE FOR CARCINOMA  Medical Oncology :Oncotype score=9   Has rash over right breast,  past week, no new meds, no rx's, no new soaps,or laundry soaps, just stressed moving    Allergies: NKA  BP (!) 150/74   Pulse 81   Temp 98 F (36.7 C) (Oral)   Resp 20   Ht 5\' 4"  (1.626 m)   Wt 215 lb 12.8 oz (97.9 kg)   BMI 37.04 kg/m   Wt Readings from Last 3 Encounters:  01/26/17 215 lb 12.8 oz (97.9 kg)  12/23/16 215 lb 11.2 oz (97.8 kg)  12/16/16 214 lb 4.8 oz (97.2 kg)

## 2017-01-26 ENCOUNTER — Ambulatory Visit
Admission: RE | Admit: 2017-01-26 | Discharge: 2017-01-26 | Disposition: A | Discharge status: Home / Self Care | Payer: BLUE CROSS/BLUE SHIELD | Source: Ambulatory Visit | Attending: Radiation Oncology | Admitting: Radiation Oncology

## 2017-01-26 ENCOUNTER — Encounter: Payer: Self-pay | Admitting: Radiation Oncology

## 2017-01-26 DIAGNOSIS — Z170 Estrogen receptor positive status [ER+]: Principal | ICD-10-CM

## 2017-01-26 DIAGNOSIS — Z510 Encounter for antineoplastic radiation therapy: Secondary | ICD-10-CM | POA: Diagnosis not present

## 2017-01-26 DIAGNOSIS — C50411 Malignant neoplasm of upper-outer quadrant of right female breast: Secondary | ICD-10-CM

## 2017-01-26 NOTE — Progress Notes (Signed)
Radiation Oncology         [phone]) [phone] ________________________________  Name: Rhonda Cooley        MRN: [id number]  Date of Service: 01/26/2017 DOB: 1955/12/29  [id number], [facility]  Alphonsa Overall, MD     REFERRING PHYSICIAN: Alphonsa Overall, MD   DIAGNOSIS: The encounter diagnosis was Malignant neoplasm of upper-outer quadrant of right breast in female, estrogen receptor positive ([place]).   HISTORY OF PRESENT ILLNESS: Rhonda Cooley is a 61 y.o. female seen at the request of Dr. Lucia Gaskins for a new diagnosis of right breast cancer. The patient noted a palpable mass and diagnostic imaging confirmed the palpable mass. On ultrasound it measured 13 x 10 x 11 mm in the upper outer quadrant at 11:30. Her axilla was negative for adenopathy. 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 Patient underwent right breast lumpectomy on 12/26/16. This revealed invasive ductal carcinoma with DCIS, grade 1, 2.0 cm. All margins were negative. There were 3 benign lymph nodes. Oncotype testing revealed a score of 9. The patient presents with a rash over her right breast that appeared in the past week. She complains the rash is occasionally itchy. She denies any new medication or soaps. She attributes the rash to stress from moving. The patient presents today to discuss and plan her radiation treatment.  PREVIOUS RADIATION THERAPY: No   PAST MEDICAL HISTORY:  Past Medical History:  Diagnosis Date  . Anxiety   . BPPV (benign paroxysmal positional vertigo)   . Breast cancer ([place]) 12/02/2016   right breast  . Chronic headaches   . Complication of anesthesia    Hard to wake up  . Endometriosis    "went in and cleaned"  . Gall stones   . Lumbar radiculopathy   . Palpitations   . PONV (postoperative nausea and vomiting)    Nausea without vomiting  . Varicose veins        PAST SURGICAL  HISTORY: Past Surgical History:  Procedure Laterality Date  . BACK SURGERY  2014  . BREAST LUMPECTOMY WITH RADIOACTIVE SEED AND SENTINEL LYMPH NODE BIOPSY Right 12/26/2016   Procedure: BREAST LUMPECTOMY WITH RADIOACTIVE SEED AND RIGHT AXILLARY SENTINEL LYMPH NODE BIOPSY;  Surgeon: Alphonsa Overall, MD;  Location: [facility];  Service: General;  Laterality: Right;  . CERVICAL POLYPECTOMY    . COLONOSCOPY W/ POLYPECTOMY    . TONSILLECTOMY AND ADENOIDECTOMY     as child     FAMILY HISTORY:  Family History  Problem Relation Age of Onset  . Alzheimer's disease Mother   . Hypertension Sister   . Hypertension Brother      SOCIAL HISTORY:  reports that she quit smoking about 28 years ago. Her smoking use included cigarettes. She has a 7.50 pack-year smoking history. she has never used smokeless tobacco. She reports that she does not drink alcohol or use drugs. She is married and lives in [place]. She is accompanied by her adult daughter. She also has a 39 year old child.    ALLERGIES: Patient has no known allergies.   MEDICATIONS:  Current Outpatient Medications  Medication Sig Dispense Refill  . guaiFENesin (MUCINEX) 600 MG 12 hr tablet Take 600 mg daily as needed by mouth for cough or to loosen phlegm.    Marland Kitchen HYDROcodone-acetaminophen (NORCO/VICODIN) 5-325 MG tablet Take 1-2 tablets every 6 (six) hours as needed by mouth for moderate pain. (Patient not  taking: Reported on 01/26/2017) 15 tablet 0  . vitamin C (ASCORBIC ACID) 250 MG tablet Take 250 mg daily by mouth.     No current facility-administered medications for this encounter.      REVIEW OF SYSTEMS: On review of systems, the patient reports that she is doing well overall. She notes a rash over the right breast. She denies any chest pain, shortness of breath, cough, fevers, chills, night sweats, unintended weight changes. She denies any bowel or bladder disturbances, and denies abdominal pain, nausea or vomiting. She denies any new  musculoskeletal or joint aches or pains. A complete review of systems is obtained and is otherwise negative.     PHYSICAL EXAM:  Wt Readings from Last 3 Encounters:  01/26/17 215 lb 12.8 oz (97.9 kg)  12/23/16 215 lb 11.2 oz (97.8 kg)  12/16/16 214 lb 4.8 oz (97.2 kg)   Temp Readings from Last 3 Encounters:  01/26/17 98 F (36.7 C) (Oral)  12/26/16 98 F (36.7 C)  12/23/16 98 F (36.7 C)   BP Readings from Last 3 Encounters:  01/26/17 (!) 150/74  12/26/16 (!) 157/93  12/23/16 132/62   Pulse Readings from Last 3 Encounters:  01/26/17 81  12/26/16 78  12/23/16 88   Pain Assessment Pain Score: 0-No pain/10  In general this is a well appearing caucasian woman in no acute distress. She's alert and oriented x4 and appropriate throughout the examination. Cardiopulmonary assessment is negative for acute distress and she exhibits normal effort. Breast exam revealed faint rash across the central portion of the right breast. Incision is well healed without signs of drainage or infection.   ECOG = 0  0 - Asymptomatic (Fully active, able to carry on all predisease activities without restriction)  1 - Symptomatic but completely ambulatory (Restricted in physically strenuous activity but ambulatory and able to carry out work of a light or sedentary nature. For example, light housework, office work)  2 - Symptomatic, <50% in bed during the day (Ambulatory and capable of all self care but unable to carry out any work activities. Up and about more than 50% of waking hours)  3 - Symptomatic, >50% in bed, but not bedbound (Capable of only limited self-care, confined to bed or chair 50% or more of waking hours)  4 - Bedbound (Completely disabled. Cannot carry on any self-care. Totally confined to bed or chair)  5 - Death   Eustace Pen MM, Creech RH, Tormey DC, et al. [phone]). "Toxicity and response criteria of the [facility]". [place] Oncol. 5 (6):  649-55    LABORATORY DATA:  Lab Results  Component Value Date   WBC 8.9 12/23/2016   HGB 11.8 (L) 12/23/2016   HCT 35.8 (L) 12/23/2016   MCV 90.4 12/23/2016   PLT 249 12/23/2016   Lab Results  Component Value Date   NA 138 12/23/2016   K 4.0 12/23/2016   CL 108 12/23/2016   CO2 23 12/23/2016   Lab Results  Component Value Date   ALT 36 03/05/2016   AST 34 03/05/2016   ALKPHOS 85 03/05/2016   BILITOT 1.3 (H) 03/05/2016      RADIOGRAPHY: No results found.     IMPRESSION/PLAN: 1. Stage IA [id number] grade 1, ER positive, invasive ductal carcinoma with DCIS of the right breast. She is status post right breast lumpectomy. Dr. Lisbeth Renshaw discusses the pathology findings and reviews the nature of  breast disease. We discussed the risks, benefits, short, and long term  effects of radiotherapy, and the patient is interested in proceeding. Dr. Lisbeth Renshaw discusses the delivery and logistics of radiotherapy and anticipates a course of 4 weeks. CT simulation and treatment planning will be scheduled. Anticipate treatment to begin the following week. A consent form was signed and a copy was provided to the patient.   2. Advised the patient to apply Benadryl cream over the erythematous area in the right breast.  In a visit lasting 45 minutes, greater than 50% of the time was spent face to face discussing options of radiotherapy, and coordinating the patient's care.    This document serves as a record of services personally performed by Kyung Rudd, MD. It was created on his behalf by Bethann Humble, a trained medical scribe. The creation of this record is based on the scribe's personal observations and the provider's statements to them. This document has been checked and approved by the attending provider.

## 2017-01-26 NOTE — Progress Notes (Signed)
Please see the Nurse Progress Note in the MD Initial Consult Encounter for this patient. 

## 2017-02-03 ENCOUNTER — Ambulatory Visit
Admission: RE | Admit: 2017-02-03 | Discharge: 2017-02-03 | Disposition: A | Discharge status: Home / Self Care | Payer: BLUE CROSS/BLUE SHIELD | Source: Ambulatory Visit | Attending: Radiation Oncology | Admitting: Radiation Oncology

## 2017-02-03 DIAGNOSIS — Z170 Estrogen receptor positive status [ER+]: Principal | ICD-10-CM

## 2017-02-03 DIAGNOSIS — C50411 Malignant neoplasm of upper-outer quadrant of right female breast: Secondary | ICD-10-CM

## 2017-02-03 DIAGNOSIS — Z510 Encounter for antineoplastic radiation therapy: Secondary | ICD-10-CM | POA: Diagnosis not present

## 2017-02-03 NOTE — Progress Notes (Signed)
  Radiation Oncology         [phone] ________________________________  Name: Rhonda Cooley MRN: [id number]  Date: 02/03/2017  DOB: 1955/08/31   DIAGNOSIS:     ICD-10-CM   1. Malignant neoplasm of upper-outer quadrant of right breast in female, estrogen receptor positive ([place]) C50.411    Z17.0     SIMULATION AND TREATMENT PLANNING NOTE  The patient presented for simulation prior to beginning her course of radiation treatment for her diagnosis of right-sided breast cancer. The patient was placed in a supine position on a breast board. A customized vac-lock bag was constructed and this complex treatment device will be used on a daily basis during her treatment. In this fashion, a CT scan was obtained through the chest area and an isocenter was placed near the chest wall within the breast.  The patient will be planned to receive a course of radiation initially to a dose of 42.5 Gy. This will consist of a whole breast radiotherapy technique. To accomplish this, 2 customized blocks have been designed which will correspond to medial and lateral whole breast tangent fields. This treatment will be accomplished at 2.5 Gy per fraction. A forward planning technique will also be evaluated to determine if this approach improves the plan. It is anticipated that the patient will then receive a 7.5 Gy boost to the seroma cavity which has been contoured. This will be accomplished at 2.5 Gy per fraction.   This initial treatment will consist of a 3-D conformal technique. The seroma has been contoured as the primary target structure. Additionally, dose volume histograms of both this target as well as the lungs and heart will also be evaluated. Such an approach is necessary to ensure that the target area is adequately covered while the nearby critical  normal structures are adequately spared.  Plan:  The final anticipated total dose therefore will correspond to 50  Gy.    _______________________________   Jodelle Gross, MD, PhD

## 2017-02-03 NOTE — Progress Notes (Signed)
  Radiation Oncology         [phone] ________________________________  Name: Rhonda Cooley MRN: [id number]  Date: 02/03/2017  DOB: Sep 04, 1955  Optical Surface Tracking Plan:  Since intensity modulated radiotherapy (IMRT) and 3D conformal radiation treatment methods are predicated on accurate and precise positioning for treatment, intrafraction motion monitoring is medically necessary to ensure accurate and safe treatment delivery.  The ability to quantify intrafraction motion without excessive ionizing radiation dose can only be performed with optical surface tracking. Accordingly, surface imaging offers the opportunity to obtain 3D measurements of patient position throughout IMRT and 3D treatments without excessive radiation exposure.  I am ordering optical surface tracking for this patient's upcoming course of radiotherapy. ________________________________  Kyung Rudd, MD 02/03/2017 6:05 PM    Reference:   Particia Jasper, et al. Surface imaging-based analysis of intrafraction motion for breast radiotherapy patients.Journal of [place], n. 6, nov. 2014. ISSN [id number].   Available at: <http://www.jacmp.org/index.php/jacmp/article/view/4957>.

## 2017-02-06 ENCOUNTER — Telehealth: Payer: Self-pay | Admitting: Hematology

## 2017-02-06 NOTE — Telephone Encounter (Signed)
Scheduled appt per 12/28 sch message - patient is aware of appt date and time.  

## 2017-02-10 DIAGNOSIS — Z510 Encounter for antineoplastic radiation therapy: Secondary | ICD-10-CM | POA: Diagnosis not present

## 2017-02-13 ENCOUNTER — Ambulatory Visit
Admission: RE | Admit: 2017-02-13 | Discharge: 2017-02-13 | Disposition: A | Discharge status: Home / Self Care | Payer: BLUE CROSS/BLUE SHIELD | Source: Ambulatory Visit | Attending: Radiation Oncology | Admitting: Radiation Oncology

## 2017-02-13 DIAGNOSIS — Z510 Encounter for antineoplastic radiation therapy: Secondary | ICD-10-CM | POA: Diagnosis not present

## 2017-02-14 ENCOUNTER — Ambulatory Visit
Admission: RE | Admit: 2017-02-14 | Discharge: 2017-02-14 | Disposition: A | Discharge status: Home / Self Care | Payer: BLUE CROSS/BLUE SHIELD | Source: Ambulatory Visit | Attending: Radiation Oncology | Admitting: Radiation Oncology

## 2017-02-14 DIAGNOSIS — Z510 Encounter for antineoplastic radiation therapy: Secondary | ICD-10-CM | POA: Diagnosis not present

## 2017-02-15 ENCOUNTER — Ambulatory Visit
Admission: RE | Admit: 2017-02-15 | Discharge: 2017-02-15 | Disposition: A | Discharge status: Home / Self Care | Payer: BLUE CROSS/BLUE SHIELD | Source: Ambulatory Visit | Attending: Radiation Oncology | Admitting: Radiation Oncology

## 2017-02-15 DIAGNOSIS — Z510 Encounter for antineoplastic radiation therapy: Secondary | ICD-10-CM | POA: Diagnosis not present

## 2017-02-16 ENCOUNTER — Ambulatory Visit
Admission: RE | Admit: 2017-02-16 | Discharge: 2017-02-16 | Disposition: A | Discharge status: Home / Self Care | Payer: BLUE CROSS/BLUE SHIELD | Source: Ambulatory Visit | Attending: Radiation Oncology | Admitting: Radiation Oncology

## 2017-02-16 DIAGNOSIS — Z510 Encounter for antineoplastic radiation therapy: Secondary | ICD-10-CM | POA: Diagnosis not present

## 2017-02-17 ENCOUNTER — Ambulatory Visit
Admission: RE | Admit: 2017-02-17 | Discharge: 2017-02-17 | Disposition: A | Discharge status: Home / Self Care | Payer: BLUE CROSS/BLUE SHIELD | Source: Ambulatory Visit | Attending: Radiation Oncology | Admitting: Radiation Oncology

## 2017-02-17 DIAGNOSIS — C50411 Malignant neoplasm of upper-outer quadrant of right female breast: Secondary | ICD-10-CM

## 2017-02-17 DIAGNOSIS — Z170 Estrogen receptor positive status [ER+]: Principal | ICD-10-CM

## 2017-02-17 DIAGNOSIS — Z510 Encounter for antineoplastic radiation therapy: Secondary | ICD-10-CM | POA: Diagnosis not present

## 2017-02-17 MED ORDER — ALRA NON-METALLIC DEODORANT (RAD-ONC)
1.0000 "application " | Freq: Once | TOPICAL | Status: AC
  Administered 2017-02-17: 1 via TOPICAL

## 2017-02-17 MED ORDER — RADIAPLEXRX EX GEL
Freq: Once | CUTANEOUS | Status: AC
  Administered 2017-02-17: 17:00:00 via TOPICAL

## 2017-02-17 NOTE — Progress Notes (Signed)

## 2017-02-20 ENCOUNTER — Ambulatory Visit
Admission: RE | Admit: 2017-02-20 | Discharge: 2017-02-20 | Disposition: A | Discharge status: Home / Self Care | Payer: BLUE CROSS/BLUE SHIELD | Source: Ambulatory Visit | Attending: Radiation Oncology | Admitting: Radiation Oncology

## 2017-02-20 DIAGNOSIS — Z510 Encounter for antineoplastic radiation therapy: Secondary | ICD-10-CM | POA: Diagnosis not present

## 2017-02-21 ENCOUNTER — Ambulatory Visit
Admission: RE | Admit: 2017-02-21 | Discharge: 2017-02-21 | Disposition: A | Discharge status: Home / Self Care | Payer: BLUE CROSS/BLUE SHIELD | Source: Ambulatory Visit | Attending: Radiation Oncology | Admitting: Radiation Oncology

## 2017-02-21 DIAGNOSIS — Z510 Encounter for antineoplastic radiation therapy: Secondary | ICD-10-CM | POA: Diagnosis not present

## 2017-02-22 ENCOUNTER — Ambulatory Visit: Payer: BLUE CROSS/BLUE SHIELD

## 2017-02-23 ENCOUNTER — Ambulatory Visit
Admission: RE | Admit: 2017-02-23 | Discharge: 2017-02-23 | Disposition: A | Discharge status: Home / Self Care | Payer: BLUE CROSS/BLUE SHIELD | Source: Ambulatory Visit | Attending: Radiation Oncology | Admitting: Radiation Oncology

## 2017-02-23 DIAGNOSIS — Z510 Encounter for antineoplastic radiation therapy: Secondary | ICD-10-CM | POA: Diagnosis not present

## 2017-02-24 ENCOUNTER — Ambulatory Visit
Admission: RE | Admit: 2017-02-24 | Discharge: 2017-02-24 | Disposition: A | Discharge status: Home / Self Care | Payer: BLUE CROSS/BLUE SHIELD | Source: Ambulatory Visit | Attending: Radiation Oncology | Admitting: Radiation Oncology

## 2017-02-24 DIAGNOSIS — Z510 Encounter for antineoplastic radiation therapy: Secondary | ICD-10-CM | POA: Diagnosis not present

## 2017-02-27 ENCOUNTER — Ambulatory Visit
Admission: RE | Admit: 2017-02-27 | Discharge: 2017-02-27 | Disposition: A | Discharge status: Home / Self Care | Payer: BLUE CROSS/BLUE SHIELD | Source: Ambulatory Visit | Attending: Radiation Oncology | Admitting: Radiation Oncology

## 2017-02-27 DIAGNOSIS — Z510 Encounter for antineoplastic radiation therapy: Secondary | ICD-10-CM | POA: Diagnosis not present

## 2017-02-28 ENCOUNTER — Ambulatory Visit: Payer: BLUE CROSS/BLUE SHIELD

## 2017-03-01 ENCOUNTER — Ambulatory Visit
Admission: RE | Admit: 2017-03-01 | Discharge: 2017-03-01 | Disposition: A | Discharge status: Home / Self Care | Payer: BLUE CROSS/BLUE SHIELD | Source: Ambulatory Visit | Attending: Radiation Oncology | Admitting: Radiation Oncology

## 2017-03-01 DIAGNOSIS — Z510 Encounter for antineoplastic radiation therapy: Secondary | ICD-10-CM | POA: Diagnosis not present

## 2017-03-02 ENCOUNTER — Ambulatory Visit
Admission: RE | Admit: 2017-03-02 | Discharge: 2017-03-02 | Disposition: A | Discharge status: Home / Self Care | Payer: BLUE CROSS/BLUE SHIELD | Source: Ambulatory Visit | Attending: Radiation Oncology | Admitting: Radiation Oncology

## 2017-03-02 DIAGNOSIS — Z510 Encounter for antineoplastic radiation therapy: Secondary | ICD-10-CM | POA: Diagnosis not present

## 2017-03-03 ENCOUNTER — Ambulatory Visit
Admission: RE | Admit: 2017-03-03 | Discharge: 2017-03-03 | Disposition: A | Discharge status: Home / Self Care | Payer: BLUE CROSS/BLUE SHIELD | Source: Ambulatory Visit | Attending: Radiation Oncology | Admitting: Radiation Oncology

## 2017-03-03 DIAGNOSIS — Z510 Encounter for antineoplastic radiation therapy: Secondary | ICD-10-CM | POA: Diagnosis not present

## 2017-03-05 ENCOUNTER — Other Ambulatory Visit: Payer: Self-pay | Admitting: Hematology

## 2017-03-05 DIAGNOSIS — Z170 Estrogen receptor positive status [ER+]: Principal | ICD-10-CM

## 2017-03-05 DIAGNOSIS — C50411 Malignant neoplasm of upper-outer quadrant of right female breast: Secondary | ICD-10-CM

## 2017-03-05 NOTE — Progress Notes (Signed)
[facility]  [phone] [phone]  Clinic Follow Up Note   Patient Care Team: [place], [facility] as PCP - General (Family Medicine) , , MD as Consulting Physician (Hematology) Moody, John, MD as Consulting Physician (Radiation Oncology) Medoff, Jeffrey, MD as Consulting Physician (Gastroenterology) Lowe, David, MD as Consulting Physician (Obstetrics and Gynecology) 03/06/2017  CHIEF COMPLAINTS:  Right breast cancer   Oncology History   Cancer Staging Malignant neoplasm of upper-outer quadrant of right breast in female, estrogen receptor positive (HCC) Staging form: Breast, AJCC 8th Edition - Clinical: Stage IA (cT1c, cN0, cM0, G1, ER: Positive, PR: Negative, HER2: Negative) - Signed by , , MD on 12/16/2016 - Pathologic stage from 12/26/2016: Stage IA (pT1c, pN0, cM0, G1, ER: Positive, PR: Positive, HER2: Negative, Oncotype DX score: 9) - Signed by , , MD on 03/06/2017       Malignant neoplasm of upper-outer quadrant of right breast in female, estrogen receptor positive (HCC)   11/25/2016 Mammogram    FINDINGS: In the upper-outer quadrant of the right breast there is an irregular spiculated mass in the posterior depth. No definite mammographic abnormalities are identified in the upper-outer quadrant as seen on the screening mammogram, however ultrasound will be performed for further evaluation.  Mammographic images were processed with CAD.  Ultrasound targeted to the right breast at 11:30, 4 cm from the nipple demonstrates an irregular hypoechoic mass measuring 1.3 x 1.0 x 1.1 cm. No other suspicious sonographic abnormalities are identified in the upper-outer quadrant. Multiple normal-appearing lymph nodes are identified in the right axilla.  IMPRESSION: 1. There is an suspicious right breast mass at 11:30.  2.  No evidence of right axillary  lymphadenopathy.  RECOMMENDATION: Ultrasound-guided biopsy is recommended for the right breast mass. This has been scheduled for 12/02/2016 at 2:45 p.m.       11/25/2016 Breast US    FINDINGS: In the upper-outer quadrant of the right breast there is an irregular spiculated mass in the posterior depth. No definite mammographic abnormalities are identified in the upper-outer quadrant as seen on the screening mammogram, however ultrasound will be performed for further evaluation.  Mammographic images were processed with CAD.  Ultrasound targeted to the right breast at 11:30, 4 cm from the nipple demonstrates an irregular hypoechoic mass measuring 1.3 x 1.0 x 1.1 cm. No other suspicious sonographic abnormalities are identified in the upper-outer quadrant. Multiple normal-appearing lymph nodes are identified in the right axilla.  IMPRESSION: 1. There is an suspicious right breast mass at 11:30.  2.  No evidence of right axillary lymphadenopathy.  RECOMMENDATION: Ultrasound-guided biopsy is recommended for the right breast mass. This has been scheduled for 12/02/2016 at 2:45 p.m.       12/02/2016 Procedure    Tissue Clip placement FINDINGS: Mammographic images were obtained following ultrasound guided biopsy of mass in the 11:30 o'clock location of the right breast. A ribbon shaped clip is identified within the spiculated mass in the upper-outer quadrant of the right breast.  IMPRESSION: Tissue marker clip in the expected location following biopsy.  Final Assessment: Post Procedure Mammograms for Marker Placement       12/02/2016 Initial Biopsy    Diagnosis Breast, right, needle core biopsy, upper outer, 11:30 o'clock - INVASIVE DUCTAL CARCINOMA - DUCTAL CARCINOMA IN SITU - SEE COMMENT  IMMUNOHISTOCHEMICAL AND MORPHOMETRIC ANALYSIS PERFORMED MANUALLY Estrogen Receptor: 100%, POSITIVE, STRONG STAINING INTENSITY Progesterone Receptor: 60%, POSITIVE, STRONG  STAINING INTENSITY Proliferation Marker Ki67: 5% HER2 - Negative        12/02/2016 Initial Diagnosis    Malignant neoplasm of upper-outer quadrant of right breast in female, estrogen receptor positive ([place])      12/02/2016 Receptors her2    HER2 - NEGATIVE RATIO OF HER2/CEP17 SIGNALS 1.00 AVERAGE HER2 COPY NUMBER PER CELL 1.80  Estrogen Receptor: 100%, POSITIVE, STRONG STAINING INTENSITY Progesterone Receptor: 60%, POSITIVE, STRONG STAINING INTENSITY Proliferation Marker Ki67: 5%      12/26/2016 Oncotype testing    RS 9, which predicts 10-year risk of distant recurrence 6% with tamoxifen alone. Low risk disease       12/26/2016 Pathology Results    Diagnosis 12/26/16 1. 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 Lymph node, sentinel, biopsy, right axillary ONE BENIGN LYMPH NODE (0/1) 3. Lymph node, sentinel, biopsy, right ONE BENIGN LYMPH NODE (0/1) 4. Lymph node, sentinel, biopsy, right ONE BENIGN LYMPH NODE (0/1) 5. Breast, excision, right additional superior margin BENIGN BREAST TISSUE NEGATIVE FOR CARCINOMA       12/26/2016 Surgery    Right Breast lumpectomy with sentinel Node Biopsy      02/14/2017 -  Radiation Therapy    Radiation therapy with Dr. Lisbeth Renshaw      HISTORY OF PRESENTING ILLNESS:  Rhonda Cooley 62 y.o. female is here because of newly diagnosed right breast cancer. She was referred by Dr. Lucia Gaskins. She presents with her daughter today. The mass was found by screening mammogram, she denies palpable mass before the biopsy. She noticed hot flashes over the last 6 months and recurrent sore throat and dry cough recently, otherwise she feels well. She denies fatigue, weight loss, or breast changes.   In the past she has had allergies, anxiety, lumbar radiculopathy, BPPV, gallstones, chronic headaches, varicose veins and  endometriosis. She has had back surgery, tonsillectomy and adenoidectomy, and surgery for endometriosis. Does not take medication. She is very sensitive to medication in the past. She has history of smoking 0.5 PPD x15 years, quit in 1990; denies alcohol use. She works from home and is independent with ADL's. She is up to date on PAP smear and colonoscopy in 09/2016, sigmoid polyp and rectal polyp showed fragments of hyperplasia, negative for malignancy. Routine labs in 09/2016 shows her to be mildly anemic, Hgb 11.9; Hgb in 02/2016 was 10.5; she had normal Hgb in 2012. She reports a uterine polyp was removed and benign. There is no family history of malignancy.   Prognostic indicators significant for: ER, 100% positive and PR, 60% positive, both with strong staining intensity. Proliferation marker Ki67 at 5%. HER2 negative.  GYN HISTORY: Menarchal: age 33 LMP: age 85? Contraceptive: No HRT: No 2 pregnancies, 2 births, 3 children (1 adopted)  CURRENT THERAPY: Radiation with Dr. Lisbeth Renshaw started on 02/14/17  INTERM HISTORY Rhonda Cooley presents today for follow up. Of note since last visit, she underwent a right breast lumpectomy on 12/26/16 and began radiation therapy with Dr. Lisbeth Renshaw on 02/14/17 and she has 8 more session until completion. She reports fatigue since starting. She states she did well with surgery and recovered well other than some pain muscle pulling in her right arm. She has not gone to physical therapy because she states the pain is bearable.   On review of systems, pt denies skin rashes or burns, or any other complaints at this time. Pertinent positives are listed and detailed within the above HPI.     MEDICAL  HISTORY:  Past Medical History:  Diagnosis Date  . Anxiety   . BPPV (benign paroxysmal positional vertigo)   . Breast cancer (HCC) 12/02/2016   right breast  . Chronic headaches   . Complication of anesthesia    Hard to wake up  . Endometriosis    "went in and cleaned"  .  Cooley stones   . Lumbar radiculopathy   . Palpitations   . PONV (postoperative nausea and vomiting)    Nausea without vomiting  . Varicose veins     SURGICAL HISTORY: Past Surgical History:  Procedure Laterality Date  . BACK SURGERY  2014  . BREAST LUMPECTOMY WITH RADIOACTIVE SEED AND SENTINEL LYMPH NODE BIOPSY Right 12/26/2016   Procedure: BREAST LUMPECTOMY WITH RADIOACTIVE SEED AND RIGHT AXILLARY SENTINEL LYMPH NODE BIOPSY;  Surgeon: Newman, David, MD;  Location: [facility] OR;  Service: General;  Laterality: Right;  . CERVICAL POLYPECTOMY    . COLONOSCOPY W/ POLYPECTOMY    . TONSILLECTOMY AND ADENOIDECTOMY     as child    SOCIAL HISTORY: Social History   Socioeconomic History  . Marital status: Married    Spouse name: Thomas  . Number of children: 3  . Years of education: 14  . Highest education level: Not on file  Social Needs  . Financial resource strain: Not on file  . Food insecurity - worry: Not on file  . Food insecurity - inability: Not on file  . Transportation needs - medical: Not on file  . Transportation needs - non-medical: Not on file  Occupational History    Comment: works from home  Tobacco Use  . Smoking status: Former Smoker    Packs/day: 0.50    Years: 15.00    Pack years: 7.50    Types: Cigarettes    Last attempt to quit: 03/08/1988    Years since quitting: 29.0  . Smokeless tobacco: Never Used  Substance and Sexual Activity  . Alcohol use: No    Alcohol/week: 0.0 oz  . Drug use: No  . Sexual activity: Not on file  Other Topics Concern  . Not on file  Social History Narrative   Lives at home with husband, child   Caffeine use- tea once a week    FAMILY HISTORY: Family History  Problem Relation Age of Onset  . Alzheimer's disease Mother   . Hypertension Sister   . Hypertension Brother     ALLERGIES:  has No Known Allergies.  MEDICATIONS:  Current Outpatient Medications  Medication Sig Dispense Refill  . guaiFENesin (MUCINEX) 600 MG 12  hr tablet Take 600 mg daily as needed by mouth for cough or to loosen phlegm.    . HYDROcodone-acetaminophen (NORCO/VICODIN) 5-325 MG tablet Take 1-2 tablets every 6 (six) hours as needed by mouth for moderate pain. (Patient not taking: Reported on 01/26/2017) 15 tablet 0  . vitamin C (ASCORBIC ACID) 250 MG tablet Take 250 mg daily by mouth.     No current facility-administered medications for this visit.     REVIEW OF SYSTEMS:  Constitutional: Denies fevers, chills or abnormal night sweats (+) intermittent hot flashes x6 months (+) fatigue  Eyes: Denies blurriness of vision, double vision or watery eyes Ears, nose, mouth, throat, and face: Denies mucositis (+) intermittent sore throat and dry cough  Respiratory: Denies dyspnea or wheezes (+) dry cough Cardiovascular: Denies palpitation, chest discomfort or lower extremity swelling Gastrointestinal:  Denies nausea, vomiting, constipation, diarrhea, heartburn or change in bowel habits Skin: Denies abnormal skin rashes   Lymphatics: Denies new lymphadenopathy or easy bruising Neurological:Denies numbness, tingling or new weaknesses Behavioral/Psych: Mood is stable, no new changes  MSK: pain in right arm near surgical site  All other systems were reviewed with the patient and are negative.  PHYSICAL EXAMINATION: ECOG PERFORMANCE STATUS: 1 - Symptomatic but completely ambulatory  Vitals:   03/06/17 1433  BP: (!) 159/66  Pulse: 92  Resp: 20  Temp: 98.5 F (36.9 C)  SpO2: 99%   Filed Weights   03/06/17 1433  Weight: 218 lb 6.4 oz (99.1 kg)    GENERAL:alert, no distress and comfortable SKIN: skin color, texture, turgor are normal, no rashes or significant lesions EYES: normal, conjunctiva are pink and non-injected, sclera clear OROPHARYNX:no exudate, no erythema and lips, buccal mucosa, and tongue normal  NECK: supple, thyroid normal size, non-tender, without nodularity LYMPH:  no palpable cervical, supraclavicular, axillary, or  inguinal lymphadenopathy LUNGS: clear to auscultation bilaterally with normal breathing effort HEART: regular rate & rhythm and no murmurs and no lower extremity edema ABDOMEN:abdomen soft, non-tender and normal bowel sounds Musculoskeletal:no cyanosis of digits and no clubbing  PSYCH: alert & oriented x 3 with fluent speech NEURO: no focal motor/sensory deficits BREAST: Right breast s/p lumpectomy. Surgical scar has healed very well. Mild diffuse skin erythema of the right breast. No palpable mass or adenopathy  LABORATORY DATA:  I have reviewed the data as listed CBC Latest Ref Rng & Units 12/23/2016 03/05/2016 06/01/2010  WBC 4.0 - 10.5 K/uL 8.9 6.7 14.1(H)  Hemoglobin 12.0 - 15.0 g/dL 11.8(L) 10.5(L) 13.0  Hematocrit 36.0 - 46.0 % 35.8(L) 32.2(L) 38.7  Platelets 150 - 400 K/uL 249 177 194   CMP Latest Ref Rng & Units 12/23/2016 03/05/2016 06/01/2010  Glucose 65 - 99 mg/dL 154(H) 180(H) 143(H)  BUN 6 - 20 mg/dL _0 Creatinine 0.44 - 1.00 mg/dL 0.74 0.75 0.88  Sodium 135 - 145 mmol/L 138 134(L) 138  Potassium 3.5 - 5.1 mmol/L 4.0 3.3(L) 3.9  Chloride 101 - 111 mmol/L 108 105 105  CO2 22 - 32 mmol/L 23 21(L) 20  Calcium 8.9 - 10.3 mg/dL 9.2 8.7(L) 9.4  Total Protein 6.5 - 8.1 g/dL - 7.0 7.5  Total Bilirubin 0.3 - 1.2 mg/dL - 1.3(H) 1.4(H)  Alkaline Phos 38 - 126 U/L - 85 103  AST 15 - 41 U/L - 34 41(H)  ALT 14 - 54 U/L - 36 56(H)    PATHOLOGY Diagnosis 12/02/16 Breast, right, needle core biopsy, upper outer, 11:30 o'clock - INVASIVE DUCTAL CARCINOMA - DUCTAL CARCINOMA IN SITU - SEE COMMENT Microscopic Comment Based on the biopsy, the carcinoma appears Nottingham grade 1 of 3 and measures 0.6 cm in greatest linear extent. 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 Prognostic markers (ER/PR/ki-67/HER2-FISH) are pending and will be reported in an addendum. Dr. Lyndon Code has reviewed the case and agrees with above diagnosis. These results were called to [facility] on December 05, 2016. Results: HER2 - NEGATIVE RATIO OF HER2/CEP17 SIGNALS 1.00 AVERAGE HER2 COPY NUMBER PER CELL 1.80 Results: IMMUNOHISTOCHEMICAL AND MORPHOMETRIC ANALYSIS PERFORMED MANUALLY Estrogen Receptor: 100%, POSITIVE, STRONG STAINING INTENSITY Progesterone Receptor: 60%, POSITIVE, STRONG STAINING INTENSITY Proliferation Marker Ki67: 5%  Diagnosis 12/26/16 1. 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 Lymph node, sentinel, biopsy, right axillary ONE BENIGN LYMPH NODE (0/1) 3. Lymph node, sentinel,  biopsy, right ONE BENIGN LYMPH NODE (0/1) 4. Lymph node, sentinel, biopsy, right ONE BENIGN LYMPH NODE (0/1) 5. Breast, excision, right additional superior margin BENIGN BREAST TISSUE NEGATIVE FOR CARCINOMA Microscopic Comment 1. BREAST, INVASIVE TUMOR Procedure: Lumpectomy Laterality: Right Tumor Size: 2.0 cm Histologic Type: Ductal carcinoma Grade: Tubular Differentiation: 1 Nuclear Pleomorphism: 2 Mitotic Count: 1 Ductal Carcinoma in Situ (DCIS): Present Extent of Tumor: Skin: Negative Nipple: Negative Skeletal muscle: Negative Margins: Invasive carcinoma, distance from closest margin: 1 cm from superior margin 1 of 3 FINAL for [place], Swan ([id number]) Microscopic Comment(continued) DCIS, distance from closest margin: 1 cm from superior margin Regional Lymph Nodes: Number of Lymph Nodes Examined: Number of Sentinel Lymph Nodes Examined: 3 Lymph Nodes with Macrometastases: 0 Lymph Nodes with Micrometastases: 0 Lymph Nodes with Isolated Tumor Cells: 0 Breast Prognostic Profile: [id number] Estrogen Receptor: 100% Progesterone Receptor: 60% Her2: negative Ki-67: 5% Best tumor block for sendout testing: 1C Pathologic Stage Classification (pTNM, AJCC 8th Edition): Primary Tumor (pT): pT1c Regional Lymph  Nodes (pN): pN0 Distant Metastases (pM): pMx    RADIOGRAPHIC STUDIES: I have personally reviewed the radiological images as listed and agreed with the findings in the report. No results found.   Diagnostic Mammogram 11/25/16 IMPRESSION: 1. There is an suspicious right breast mass at 11:30. 2.  No evidence of right axillary lymphadenopathy. RECOMMENDATION: Ultrasound-guided biopsy is recommended for the right breast mass. This has been scheduled for 12/02/2016 at 2:45 p.m. I have discussed the findings and recommendations with the patient. Results were also provided in writing at the conclusion of the visit. If applicable, a reminder letter will be sent to the patient regarding the next appointment. BI-RADS CATEGORY  5: Highly suggestive of malignancy.  ASSESSMENT & PLAN:  61 year old postmenopausal female with allergies, anxiety, endometriosis presents with right breast cancer.   1.  Malignant neoplasm of upper outer quadrant of right breast, invasive ductal carcinoma, pT1cN0M0, stage IA, ER 100%, PR 60%, HER2 Negative, Oncotype RS 9 --I discussed her surgical path result in details -the Oncotype Dx result was reviewed with her in details. She has low risk based on the recurrence score, which predicts 10 year distant recurrence after 5 years of tamoxifen 6%.  He does not need adjuvant chemotherapy. -She is finishing adjuvant breast radiation soon, tolerated well so far. --Given the strong ER and PR positivity, I do recommend adjuvant aromatase inhibitor to reduce her risk of cancer recurrence,  The potential benefit and side effects, which includes but not limited to, hot flash, skin and vaginal dryness, metabolic changes ( increased blood glucose, cholesterol, weight, etc.), slightly in increased risk of cardiovascular disease, cataracts, muscular and joint discomfort, osteopenia and osteoporosis, etc, were discussed with her in great details. She is little reluctant to commit to the  treatment, I printed out a handout about letrozole, and she was think about it. -Call us if she decides to try letrozole -I recommended that she'd try to be more active, eat a healthy, balanced diet, have her cholesterol and sugars checked routinely once starting AEOT. She is interesed in the [facility] program.  -F/u in 3 months, set up survivorship clinic at next visit   2. Anemia -She is mildly anemic on CBC in January and august 2018, Hgb 11.9. She is asymptomatic, denies bleeding. She had colonoscopy with polypectomy in 09/2016, negative for malignancy. We will continue to monitor closely.   PLAN -F/u in 3 months with lab, set up survivorship clinic at next visit  -She will   call soon if she agrees to start letrozole and I will call in then   All questions were answered. The patient knows to call the clinic with any problems, questions or concerns. I spent 20 minutes counseling the patient face to face. The total time spent in the appointment was 25 minutes and more than 50% was on counseling, review of test results, and coordination of care.  This document serves as a record of services personally performed by  , MD. It was created on her behalf by Dana Waskiewicz, a trained medical scribe. The creation of this record is based on the scribe's personal observations and the provider's statements to them.   I have reviewed the above documentation for accuracy and completeness, and I agree with the above.     , MD 03/06/2017    

## 2017-03-06 ENCOUNTER — Inpatient Hospital Stay: Payer: BLUE CROSS/BLUE SHIELD | Attending: Hematology | Admitting: Hematology

## 2017-03-06 ENCOUNTER — Ambulatory Visit
Admission: RE | Admit: 2017-03-06 | Discharge: 2017-03-06 | Disposition: A | Discharge status: Home / Self Care | Payer: BLUE CROSS/BLUE SHIELD | Source: Ambulatory Visit | Attending: Radiation Oncology | Admitting: Radiation Oncology

## 2017-03-06 ENCOUNTER — Encounter: Payer: Self-pay | Admitting: Hematology

## 2017-03-06 VITALS — BP 159/66 | HR 92 | Temp 98.5°F | Resp 20 | Ht 64.0 in | Wt 218.4 lb

## 2017-03-06 DIAGNOSIS — R05 Cough: Secondary | ICD-10-CM | POA: Insufficient documentation

## 2017-03-06 DIAGNOSIS — N809 Endometriosis, unspecified: Secondary | ICD-10-CM | POA: Diagnosis not present

## 2017-03-06 DIAGNOSIS — R51 Headache: Secondary | ICD-10-CM | POA: Diagnosis not present

## 2017-03-06 DIAGNOSIS — D649 Anemia, unspecified: Secondary | ICD-10-CM

## 2017-03-06 DIAGNOSIS — Z87891 Personal history of nicotine dependence: Secondary | ICD-10-CM | POA: Diagnosis not present

## 2017-03-06 DIAGNOSIS — Z79899 Other long term (current) drug therapy: Secondary | ICD-10-CM | POA: Insufficient documentation

## 2017-03-06 DIAGNOSIS — Z510 Encounter for antineoplastic radiation therapy: Secondary | ICD-10-CM | POA: Diagnosis not present

## 2017-03-06 DIAGNOSIS — H8110 Benign paroxysmal vertigo, unspecified ear: Secondary | ICD-10-CM | POA: Insufficient documentation

## 2017-03-06 DIAGNOSIS — C50411 Malignant neoplasm of upper-outer quadrant of right female breast: Secondary | ICD-10-CM | POA: Diagnosis not present

## 2017-03-06 DIAGNOSIS — R232 Flushing: Secondary | ICD-10-CM | POA: Insufficient documentation

## 2017-03-06 DIAGNOSIS — M5416 Radiculopathy, lumbar region: Secondary | ICD-10-CM

## 2017-03-06 DIAGNOSIS — Z79811 Long term (current) use of aromatase inhibitors: Secondary | ICD-10-CM | POA: Diagnosis not present

## 2017-03-06 DIAGNOSIS — Z923 Personal history of irradiation: Secondary | ICD-10-CM | POA: Diagnosis not present

## 2017-03-06 DIAGNOSIS — I8390 Asymptomatic varicose veins of unspecified lower extremity: Secondary | ICD-10-CM | POA: Diagnosis not present

## 2017-03-06 DIAGNOSIS — Z86010 Personal history of colonic polyps: Secondary | ICD-10-CM | POA: Diagnosis not present

## 2017-03-06 DIAGNOSIS — F419 Anxiety disorder, unspecified: Secondary | ICD-10-CM | POA: Insufficient documentation

## 2017-03-06 DIAGNOSIS — Z170 Estrogen receptor positive status [ER+]: Secondary | ICD-10-CM | POA: Insufficient documentation

## 2017-03-07 ENCOUNTER — Ambulatory Visit
Admission: RE | Admit: 2017-03-07 | Discharge: 2017-03-07 | Disposition: A | Discharge status: Home / Self Care | Payer: BLUE CROSS/BLUE SHIELD | Source: Ambulatory Visit | Attending: Radiation Oncology | Admitting: Radiation Oncology

## 2017-03-07 ENCOUNTER — Telehealth: Payer: Self-pay

## 2017-03-07 DIAGNOSIS — Z510 Encounter for antineoplastic radiation therapy: Secondary | ICD-10-CM | POA: Diagnosis not present

## 2017-03-07 NOTE — Telephone Encounter (Signed)
Spoke with patient concerning upcoming appointment on 4/29 per 1/28 los

## 2017-03-08 ENCOUNTER — Ambulatory Visit: Payer: BLUE CROSS/BLUE SHIELD

## 2017-03-09 ENCOUNTER — Ambulatory Visit: Payer: BLUE CROSS/BLUE SHIELD

## 2017-03-09 ENCOUNTER — Ambulatory Visit
Admission: RE | Admit: 2017-03-09 | Discharge: 2017-03-09 | Disposition: A | Discharge status: Home / Self Care | Payer: BLUE CROSS/BLUE SHIELD | Source: Ambulatory Visit | Attending: Radiation Oncology | Admitting: Radiation Oncology

## 2017-03-09 DIAGNOSIS — Z510 Encounter for antineoplastic radiation therapy: Secondary | ICD-10-CM | POA: Diagnosis not present

## 2017-03-10 ENCOUNTER — Ambulatory Visit: Payer: BLUE CROSS/BLUE SHIELD

## 2017-03-10 ENCOUNTER — Ambulatory Visit
Admission: RE | Admit: 2017-03-10 | Discharge: 2017-03-10 | Disposition: A | Discharge status: Home / Self Care | Payer: BLUE CROSS/BLUE SHIELD | Source: Ambulatory Visit | Attending: Radiation Oncology | Admitting: Radiation Oncology

## 2017-03-10 DIAGNOSIS — Z510 Encounter for antineoplastic radiation therapy: Secondary | ICD-10-CM | POA: Diagnosis not present

## 2017-03-13 ENCOUNTER — Ambulatory Visit: Payer: BLUE CROSS/BLUE SHIELD

## 2017-03-13 ENCOUNTER — Ambulatory Visit
Admission: RE | Admit: 2017-03-13 | Discharge: 2017-03-13 | Disposition: A | Discharge status: Home / Self Care | Payer: BLUE CROSS/BLUE SHIELD | Source: Ambulatory Visit | Attending: Radiation Oncology | Admitting: Radiation Oncology

## 2017-03-13 DIAGNOSIS — Z510 Encounter for antineoplastic radiation therapy: Secondary | ICD-10-CM | POA: Diagnosis not present

## 2017-03-14 ENCOUNTER — Ambulatory Visit: Payer: BLUE CROSS/BLUE SHIELD

## 2017-03-14 ENCOUNTER — Ambulatory Visit
Admission: RE | Admit: 2017-03-14 | Discharge: 2017-03-14 | Disposition: A | Discharge status: Home / Self Care | Payer: BLUE CROSS/BLUE SHIELD | Source: Ambulatory Visit | Attending: Radiation Oncology | Admitting: Radiation Oncology

## 2017-03-14 DIAGNOSIS — Z510 Encounter for antineoplastic radiation therapy: Secondary | ICD-10-CM | POA: Diagnosis not present

## 2017-03-15 ENCOUNTER — Ambulatory Visit: Payer: BLUE CROSS/BLUE SHIELD

## 2017-03-16 ENCOUNTER — Ambulatory Visit: Payer: BLUE CROSS/BLUE SHIELD

## 2017-03-16 ENCOUNTER — Inpatient Hospital Stay
Admission: RE | Admit: 2017-03-16 | Discharge: 2017-03-16 | Disposition: A | Discharge status: Home / Self Care | Payer: Self-pay | Source: Ambulatory Visit | Attending: Radiation Oncology | Admitting: Radiation Oncology

## 2017-03-16 DIAGNOSIS — Z510 Encounter for antineoplastic radiation therapy: Secondary | ICD-10-CM | POA: Diagnosis not present

## 2017-03-17 ENCOUNTER — Ambulatory Visit: Payer: BLUE CROSS/BLUE SHIELD

## 2017-03-17 ENCOUNTER — Ambulatory Visit
Admission: RE | Admit: 2017-03-17 | Discharge: 2017-03-17 | Disposition: A | Discharge status: Home / Self Care | Payer: BLUE CROSS/BLUE SHIELD | Source: Ambulatory Visit | Attending: Radiation Oncology | Admitting: Radiation Oncology

## 2017-03-17 DIAGNOSIS — C50411 Malignant neoplasm of upper-outer quadrant of right female breast: Secondary | ICD-10-CM | POA: Diagnosis not present

## 2017-03-17 DIAGNOSIS — Z170 Estrogen receptor positive status [ER+]: Secondary | ICD-10-CM | POA: Diagnosis not present

## 2017-03-17 DIAGNOSIS — Z510 Encounter for antineoplastic radiation therapy: Secondary | ICD-10-CM | POA: Diagnosis not present

## 2017-03-17 DIAGNOSIS — F419 Anxiety disorder, unspecified: Secondary | ICD-10-CM | POA: Diagnosis not present

## 2017-03-17 DIAGNOSIS — Z87891 Personal history of nicotine dependence: Secondary | ICD-10-CM | POA: Diagnosis not present

## 2017-03-28 ENCOUNTER — Encounter: Payer: Self-pay | Admitting: Radiation Oncology

## 2017-03-28 NOTE — Progress Notes (Signed)
  Radiation Oncology         [phone] ________________________________  Name: Teagyn Fishel MRN: [id number]  Date: 03/28/2017  DOB: 11-26-1955  End of Treatment Note  Diagnosis: Stage IA cT1cN0M0 grade 1, ER positive, invasive ductal carcinoma with DCIS of the right breast     Indication for treatment:  Curative       Radiation treatment dates: 02/14/17-03/17/17  Site/dose:  1) Right breast/ 42.5 Gy in 17 fractions   2) Right breast boost/ 7.5 Gy in 3 fractions  Beams/energy:   1) 3D/ 10 X    2) Special teletherapy/ 15 E  Narrative: The patient tolerated radiation treatment relatively well. She denied pain but reported itching to the UIQ of the right breast. Erythema and hyperpigmentation without desquamation noted in the treatment area.  Plan: The patient has completed radiation treatment. The patient will return to radiation oncology clinic for routine followup in one month. I advised them to call or return sooner if they have any questions or concerns related to their recovery or treatment.  ------------------------------------------------  Jodelle Gross, MD, PhD  This document serves as a record of services personally performed by Kyung Rudd, MD. It was created on his behalf by Bethann Humble, a trained medical scribe. The creation of this record is based on the scribe's personal observations and the provider's statements to them. This document has been checked and approved by the attending provider.

## 2017-04-19 ENCOUNTER — Other Ambulatory Visit: Payer: Self-pay

## 2017-04-19 ENCOUNTER — Encounter: Payer: Self-pay | Admitting: Radiation Oncology

## 2017-04-19 ENCOUNTER — Ambulatory Visit
Admission: RE | Admit: 2017-04-19 | Discharge: 2017-04-19 | Disposition: A | Discharge status: Home / Self Care | Payer: BLUE CROSS/BLUE SHIELD | Source: Ambulatory Visit | Attending: Radiation Oncology | Admitting: Radiation Oncology

## 2017-04-19 VITALS — BP 145/70 | HR 94 | Temp 98.2°F | Resp 20 | Ht 64.0 in | Wt 215.8 lb

## 2017-04-19 DIAGNOSIS — Z923 Personal history of irradiation: Secondary | ICD-10-CM | POA: Insufficient documentation

## 2017-04-19 DIAGNOSIS — Z79899 Other long term (current) drug therapy: Secondary | ICD-10-CM | POA: Diagnosis not present

## 2017-04-19 DIAGNOSIS — C50411 Malignant neoplasm of upper-outer quadrant of right female breast: Secondary | ICD-10-CM

## 2017-04-19 DIAGNOSIS — J069 Acute upper respiratory infection, unspecified: Secondary | ICD-10-CM | POA: Diagnosis not present

## 2017-04-19 DIAGNOSIS — C50911 Malignant neoplasm of unspecified site of right female breast: Secondary | ICD-10-CM | POA: Diagnosis not present

## 2017-04-19 DIAGNOSIS — L819 Disorder of pigmentation, unspecified: Secondary | ICD-10-CM | POA: Insufficient documentation

## 2017-04-19 DIAGNOSIS — Z170 Estrogen receptor positive status [ER+]: Secondary | ICD-10-CM | POA: Insufficient documentation

## 2017-04-19 MED ORDER — ALRA NON-METALLIC DEODORANT (RAD-ONC)
1.0000 "application " | Freq: Once | TOPICAL | Status: AC
  Administered 2017-04-19: 1 via TOPICAL

## 2017-04-19 NOTE — Progress Notes (Signed)
  Radiation Oncology         [phone] ________________________________  Name: Rhonda Cooley MRN: [id number]  Date of Service: 04/19/2017  DOB: [birth year]  Post Treatment Note  CC: [facility]  Rhonda Overall, MD  Diagnosis:   Stage IA cT1cN0M0 grade 1, ER positive, invasive ductal carcinoma with DCIS of the right breast  Interval Since Last Radiation:  5 weeks   02/14/17-03/17/17 1. Right breast/ 42.5 Gy in 17 fractions 2. Right breast boost/ 7.5 Gy in 3 fractions   Narrative:  The patient returns today for routine follow-up. During treatment she did very well with radiotherapy and did not have significant desquamation.                             On review of systems, the patient states she's doing well Cooley. She requests another Alara deodorant, and states that she noticed some yellow discharge along the skin of the base of the nipple a few days ago. No other visible lesions or recurrent drainage is noted. No other concerns are verbalized.  ALLERGIES:  has No Known Allergies.  Meds: Current Outpatient Medications  Medication Sig Dispense Refill  . guaiFENesin (MUCINEX) 600 MG 12 hr tablet Take 600 mg daily as needed by mouth for cough or to loosen phlegm.    . vitamin C (ASCORBIC ACID) 250 MG tablet Take 250 mg daily by mouth.     No current facility-administered medications for this encounter.     Physical Findings:  height is 5\' 4"  (1.626 m) and weight is 215 lb 12.8 oz (97.9 kg). Her oral temperature is 98.2 F (36.8 C). Her blood pressure is 145/70 (abnormal) and her pulse is 94. Her respiration is 20 and oxygen saturation is 97%.  Pain Assessment Pain Score: 0-No pain/10 In general this is a well appearing caucasian female in no acute distress. She's alert and oriented x4 and appropriate throughout the examination. Cardiopulmonary assessment is negative for acute distress and she exhibits normal effort. The right breast was examined  and reveals mild hyperpigmentation, and some crusting along the base of the nipple. No erythema is noted.   Lab Findings: Lab Results  Component Value Date   WBC 8.9 12/23/2016   HGB 11.8 (L) 12/23/2016   HCT 35.8 (L) 12/23/2016   MCV 90.4 12/23/2016   PLT 249 12/23/2016     Radiographic Findings: No results found.  Impression/Plan: 1. Stage IA cT1cN0M0 grade 1, ER positive, invasive ductal carcinoma with DCIS of the right breast. The patient has been doing well since completion of radiotherapy. We discussed that we would be happy to continue to follow her as needed, but she will also continue to follow up with Dr. Burr Cooley in medical oncology. She was counseled on skin care as well as measures to avoid sun exposure to this area. She will keep [place] informed of any additional discharge from the skin along the base of the nipple. At this point though, it appears to be consistent with recent peeling of the skin from radiotherapy. Another Alara deodorant stick was also administered to the patient as well. 2. Survivorship. She will be set up for survivorship visit at the appropriate time per Dr. Burr Cooley. 3. URI. The patient has been seen by PCP and will try netti pot as well as her mucinex.      Carola Rhine, PAC

## 2017-04-19 NOTE — Addendum Note (Signed)
Encounter addended by: Malena Edman, RN on: 04/19/2017 4:06 PM  Actions taken: Charge Capture section accepted

## 2017-06-03 NOTE — Progress Notes (Signed)
[place]  Telephone:([phone]) [phone] Fax:(336) [phone]  Clinic Follow Up Note   Patient Care Team: [name] as PCP - General (Family Medicine) Truitt Merle, MD as Consulting Physician (Hematology) Kyung Rudd, MD as Consulting Physician (Radiation Oncology) Richmond Campbell, MD as Consulting Physician (Gastroenterology) Louretta Shorten, MD as Consulting Physician (Obstetrics and Gynecology) 06/05/2017  CHIEF COMPLAINTS:  Right breast cancer   Oncology History   Cancer Staging Malignant neoplasm of upper-outer quadrant of right breast in female, estrogen receptor positive ([place]) Staging form: Breast, AJCC 8th Edition - Clinical: Stage IA (cT1c, cN0, cM0, G1, ER: Positive, PR: Negative, HER2: Negative) - Signed by Truitt Merle, MD on 12/16/2016 - Pathologic stage from 12/26/2016: Stage IA (pT1c, pN0, cM0, G1, ER: Positive, PR: Positive, HER2: Negative, Oncotype DX score: 9) - Signed by Truitt Merle, MD on 03/06/2017       Malignant neoplasm of upper-outer quadrant of right breast in female, estrogen receptor positive ([place])   11/25/2016 Mammogram    FINDINGS: In the upper-outer quadrant of the right breast there is an irregular spiculated mass in the posterior depth. No definite mammographic abnormalities are identified in the upper-outer quadrant as seen on the screening mammogram, however ultrasound will be performed for further evaluation.  Mammographic images were processed with CAD.  Ultrasound targeted to the right breast at 11:30, 4 cm from the nipple demonstrates an irregular hypoechoic mass measuring 1.3 x 1.0 x 1.1 cm. No other suspicious sonographic abnormalities are identified in the upper-outer quadrant. Multiple normal-appearing lymph nodes are identified in the right axilla.  IMPRESSION: 1. There is an suspicious right breast mass at 11:30.  2.  No evidence of right axillary  lymphadenopathy.  RECOMMENDATION: Ultrasound-guided biopsy is recommended for the right breast mass. This has been scheduled for 12/02/2016 at 2:45 p.m.       11/25/2016 Breast US    FINDINGS: In the upper-outer quadrant of the right breast there is an irregular spiculated mass in the posterior depth. No definite mammographic abnormalities are identified in the upper-outer quadrant as seen on the screening mammogram, however ultrasound will be performed for further evaluation.  Mammographic images were processed with CAD.  Ultrasound targeted to the right breast at 11:30, 4 cm from the nipple demonstrates an irregular hypoechoic mass measuring 1.3 x 1.0 x 1.1 cm. No other suspicious sonographic abnormalities are identified in the upper-outer quadrant. Multiple normal-appearing lymph nodes are identified in the right axilla.  IMPRESSION: 1. There is an suspicious right breast mass at 11:30.  2.  No evidence of right axillary lymphadenopathy.  RECOMMENDATION: Ultrasound-guided biopsy is recommended for the right breast mass. This has been scheduled for 12/02/2016 at 2:45 p.m.       12/02/2016 Procedure    Tissue Clip placement FINDINGS: Mammographic images were obtained following ultrasound guided biopsy of mass in the 11:30 o'clock location of the right breast. A ribbon shaped clip is identified within the spiculated mass in the upper-outer quadrant of the right breast.  IMPRESSION: Tissue marker clip in the expected location following biopsy.  Final Assessment: Post Procedure Mammograms for Marker Placement       12/02/2016 Initial Biopsy    Diagnosis Breast, right, needle core biopsy, upper outer, 11:30 o'clock - INVASIVE DUCTAL CARCINOMA - DUCTAL CARCINOMA IN SITU - SEE COMMENT  IMMUNOHISTOCHEMICAL AND MORPHOMETRIC ANALYSIS PERFORMED MANUALLY Estrogen Receptor: 100%, POSITIVE, STRONG STAINING INTENSITY Progesterone Receptor: 60%, POSITIVE, STRONG  STAINING INTENSITY Proliferation Marker Ki67: 5% HER2 - Negative  12/02/2016 Initial Diagnosis    Malignant neoplasm of upper-outer quadrant of right breast in female, estrogen receptor positive ([place])      12/02/2016 Receptors her2    HER2 - NEGATIVE RATIO OF HER2/CEP17 SIGNALS 1.00 AVERAGE HER2 COPY NUMBER PER CELL 1.80  Estrogen Receptor: 100%, POSITIVE, STRONG STAINING INTENSITY Progesterone Receptor: 60%, POSITIVE, STRONG STAINING INTENSITY Proliferation Marker Ki67: 5%      12/26/2016 Oncotype testing    RS 9, which predicts 10-year risk of distant recurrence 6% with tamoxifen alone. Low risk disease       12/26/2016 Pathology Results    Diagnosis 12/26/16 1. 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 Lymph node, sentinel, biopsy, right axillary ONE BENIGN LYMPH NODE (0/1) 3. Lymph node, sentinel, biopsy, right ONE BENIGN LYMPH NODE (0/1) 4. Lymph node, sentinel, biopsy, right ONE BENIGN LYMPH NODE (0/1) 5. Breast, excision, right additional superior margin BENIGN BREAST TISSUE NEGATIVE FOR CARCINOMA       12/26/2016 Surgery    Right Breast lumpectomy with sentinel Node Biopsy      02/14/2017 - 03/17/2017 Radiation Therapy    Radiation therapy with Dr. Lisbeth Renshaw      HISTORY OF PRESENTING ILLNESS:  Rhonda Cooley 62 y.o. female is here because of newly diagnosed right breast cancer. She was referred by Dr. Lucia Gaskins. She presents with her daughter today. The mass was found by screening mammogram, she denies palpable mass before the biopsy. She noticed hot flashes over the last 6 months and recurrent sore throat and dry cough recently, otherwise she feels well. She denies fatigue, weight loss, or breast changes.   In the past she has had allergies, anxiety, lumbar radiculopathy, BPPV, gallstones, chronic headaches, varicose veins  and endometriosis. She has had back surgery, tonsillectomy and adenoidectomy, and surgery for endometriosis. Does not take medication. She is very sensitive to medication in the past. She has history of smoking 0.5 PPD x15 years, quit in 1990; denies alcohol use. She works from home and is independent with ADL's. She is up to date on PAP smear and colonoscopy in 09/2016, sigmoid polyp and rectal polyp showed fragments of hyperplasia, negative for malignancy. Routine labs in 09/2016 shows her to be mildly anemic, Hgb 11.9; Hgb in 02/2016 was 10.5; she had normal Hgb in 2012. She reports a uterine polyp was removed and benign. There is no family history of malignancy.   Prognostic indicators significant for: ER, 100% positive and PR, 60% positive, both with strong staining intensity. Proliferation marker Ki67 at 5%. HER2 negative.  GYN HISTORY: Menarchal: age 46 LMP: age 76? Contraceptive: No HRT: No 2 pregnancies, 2 births, 3 children (1 adopted)  CURRENT THERAPY: Observation  INTERM HISTORY Rhonda Cooley presents today for follow up. She presents to the clinic today by herself. She reports she is doing well overall. Since last visit, she has been considering whether or not to take anti-estrogen therapy. She is hesitant at this point and is concerned about side effects.   On review of systems, pt denies any other complaints at this time. Pertinent positives are listed and detailed within the above HPI.   MEDICAL HISTORY:  Past Medical History:  Diagnosis Date  . Anxiety   . BPPV (benign paroxysmal positional vertigo)   . Breast cancer ([place]) 12/02/2016   right breast  . Chronic headaches   . Complication of anesthesia  Hard to wake up  . Endometriosis    "went in and cleaned"  . Gall stones   . Lumbar radiculopathy   . Palpitations   . PONV (postoperative nausea and vomiting)    Nausea without vomiting  . Varicose veins     SURGICAL HISTORY: Past Surgical History:  Procedure  Laterality Date  . BACK SURGERY  2014  . BREAST LUMPECTOMY WITH RADIOACTIVE SEED AND SENTINEL LYMPH NODE BIOPSY Right 12/26/2016   Procedure: BREAST LUMPECTOMY WITH RADIOACTIVE SEED AND RIGHT AXILLARY SENTINEL LYMPH NODE BIOPSY;  Surgeon: Alphonsa Overall, MD;  Location: [facility];  Service: General;  Laterality: Right;  . CERVICAL POLYPECTOMY    . COLONOSCOPY W/ POLYPECTOMY    . TONSILLECTOMY AND ADENOIDECTOMY     as child    SOCIAL HISTORY: Social History   Socioeconomic History  . Marital status: Married    Spouse name: Marcello Moores  . Number of children: 3  . Years of education: 71  . Highest education level: Not on file  Occupational History    Comment: works from home  Social Needs  . Financial resource strain: Not on file  . Food insecurity:    Worry: Not on file    Inability: Not on file  . Transportation needs:    Medical: Not on file    Non-medical: Not on file  Tobacco Use  . Smoking status: Former Smoker    Packs/day: 0.50    Years: 15.00    Pack years: 7.50    Types: Cigarettes    Last attempt to quit: 03/08/1988    Years since quitting: 29.2  . Smokeless tobacco: Never Used  Substance and Sexual Activity  . Alcohol use: No    Alcohol/week: 0.0 oz  . Drug use: No  . Sexual activity: Not on file  Lifestyle  . Physical activity:    Days per week: Not on file    Minutes per session: Not on file  . Stress: Not on file  Relationships  . Social connections:    Talks on phone: Not on file    Gets together: Not on file    Attends religious service: Not on file    Active member of club or organization: Not on file    Attends meetings of clubs or organizations: Not on file    Relationship status: Not on file  . Intimate partner violence:    Fear of current or ex partner: Not on file    Emotionally abused: Not on file    Physically abused: Not on file    Forced sexual activity: Not on file  Other Topics Concern  . Not on file  Social History Narrative   Lives at  home with husband, child   Caffeine use- tea once a week    FAMILY HISTORY: Family History  Problem Relation Age of Onset  . Alzheimer's disease Mother   . Hypertension Sister   . Hypertension Brother     ALLERGIES:  has No Known Allergies.  MEDICATIONS:  No current outpatient medications on file.   No current facility-administered medications for this visit.     REVIEW OF SYSTEMS:  Constitutional: Denies fevers, chills or abnormal night sweats  Eyes: Denies blurriness of vision, double vision or watery eyes Ears, nose, mouth, throat, and face: Denies mucositis  Respiratory: Denies dyspnea or wheezes  Cardiovascular: Denies palpitation, chest discomfort or lower extremity swelling Gastrointestinal:  Denies nausea, vomiting, constipation, diarrhea, heartburn or change in bowel habits Skin: Denies abnormal  skin rashes Lymphatics: Denies new lymphadenopathy or easy bruising Neurological:Denies numbness, tingling or new weaknesses Behavioral/Psych: Mood is stable, no new changes  MSK: pain in right arm near surgical site  All other systems were reviewed with the patient and are negative.  PHYSICAL EXAMINATION: ECOG PERFORMANCE STATUS: 1 - Symptomatic but completely ambulatory  Vitals:   06/05/17 1504  BP: (!) 151/86  Pulse: 80  Resp: 18  Temp: 98.7 F (37.1 C)  SpO2: 100%   Filed Weights   06/05/17 1504  Weight: 220 lb 6.4 oz (100 kg)    GENERAL:alert, no distress and comfortable SKIN: skin color, texture, turgor are normal, no rashes or significant lesions EYES: normal, conjunctiva are pink and non-injected, sclera clear OROPHARYNX:no exudate, no erythema and lips, buccal mucosa, and tongue normal  NECK: supple, thyroid normal size, non-tender, without nodularity LYMPH:  no palpable cervical, supraclavicular, axillary, or inguinal lymphadenopathy LUNGS: clear to auscultation bilaterally with normal breathing effort HEART: regular rate & rhythm and no murmurs and  no lower extremity edema ABDOMEN:abdomen soft, non-tender and normal bowel sounds Musculoskeletal:no cyanosis of digits and no clubbing  PSYCH: alert & oriented x 3 with fluent speech NEURO: no focal motor/sensory deficits BREAST: Right breast s/p lumpectomy. Surgical scar has healed very well. Mild diffuse skin hyperpigmentation of the right breast. No palpable mass or adenopathy   LABORATORY DATA:  I have reviewed the data as listed CBC Latest Ref Rng & Units 06/05/2017 12/23/2016 03/05/2016  WBC 3.9 - 10.3 K/uL 7.8 8.9 6.7  Hemoglobin 11.6 - 15.9 g/dL 12.0 11.8(L) 10.5(L)  Hematocrit 34.8 - 46.6 % 37.0 35.8(L) 32.2(L)  Platelets 145 - 400 K/uL 222 249 177   CMP Latest Ref Rng & Units 06/05/2017 12/23/2016 03/05/2016  Glucose 70 - 140 mg/dL 154(H) 154(H) 180(H)  BUN 7 - 26 mg/dL _0 Creatinine 0.60 - 1.10 mg/dL 0.95 0.74 0.75  Sodium 136 - 145 mmol/L 140 138 134(L)  Potassium 3.5 - 5.1 mmol/L 3.8 4.0 3.3(L)  Chloride 98 - 109 mmol/L 107 108 105  CO2 22 - 29 mmol/L 23 23 21(L)  Calcium 8.4 - 10.4 mg/dL 9.7 9.2 8.7(L)  Total Protein 6.4 - 8.3 g/dL 7.7 - 7.0  Total Bilirubin 0.2 - 1.2 mg/dL 0.8 - 1.3(H)  Alkaline Phos 40 - 150 U/L 103 - 85  AST 5 - 34 U/L 25 - 34  ALT 0 - 55 U/L 39 - 36    PATHOLOGY Diagnosis 12/02/16 Breast, right, needle core biopsy, upper outer, 11:30 o'clock - INVASIVE DUCTAL CARCINOMA - DUCTAL CARCINOMA IN SITU - SEE COMMENT Microscopic Comment Based on the biopsy, the carcinoma appears Nottingham grade 1 of 3 and measures 0.6 cm in greatest linear extent. 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 Prognostic markers (ER/PR/ki-67/HER2-FISH) are pending and will be reported in an addendum. Dr. Lyndon Code has reviewed the case and agrees with above diagnosis. These results were called to [facility] on December 05, 2016. Results: HER2 - NEGATIVE RATIO OF HER2/CEP17 SIGNALS 1.00 AVERAGE HER2 COPY NUMBER PER CELL  1.80 Results: IMMUNOHISTOCHEMICAL AND MORPHOMETRIC ANALYSIS PERFORMED MANUALLY Estrogen Receptor: 100%, POSITIVE, STRONG STAINING INTENSITY Progesterone Receptor: 60%, POSITIVE, STRONG STAINING INTENSITY Proliferation Marker Ki67: 5%  Diagnosis 12/26/16 1. 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 Lymph node, sentinel, biopsy, right axillary ONE BENIGN LYMPH NODE (0/1) 3.  Lymph node, sentinel, biopsy, right ONE BENIGN LYMPH NODE (0/1) 4. Lymph node, sentinel, biopsy, right ONE BENIGN LYMPH NODE (0/1) 5. Breast, excision, right additional superior margin BENIGN BREAST TISSUE NEGATIVE FOR CARCINOMA Microscopic Comment 1. BREAST, INVASIVE TUMOR Procedure: Lumpectomy Laterality: Right Tumor Size: 2.0 cm Histologic Type: Ductal carcinoma Grade: Tubular Differentiation: 1 Nuclear Pleomorphism: 2 Mitotic Count: 1 Ductal Carcinoma in Situ (DCIS): Present Extent of Tumor: Skin: Negative Nipple: Negative Skeletal muscle: Negative Margins: Invasive carcinoma, distance from closest margin: 1 cm from superior margin 1 of 3 FINAL for RETHEL, SEBEK ([id number]) Microscopic Comment(continued) DCIS, distance from closest margin: 1 cm from superior margin Regional Lymph Nodes: Number of Lymph Nodes Examined: Number of Sentinel Lymph Nodes Examined: 3 Lymph Nodes with Macrometastases: 0 Lymph Nodes with Micrometastases: 0 Lymph Nodes with Isolated Tumor Cells: 0 Breast Prognostic Profile: [id number] Estrogen Receptor: 100% Progesterone Receptor: 60% Her2: negative Ki-67: 5% Best tumor block for sendout testing: 1C Pathologic Stage Classification (pTNM, AJCC 8th Edition): Primary Tumor (pT): pT1c Regional Lymph Nodes (pN): pN0 Distant Metastases (pM): pMx    RADIOGRAPHIC STUDIES: I have personally reviewed the radiological images as  listed and agreed with the findings in the report. No results found.   Diagnostic Mammogram 11/25/16 IMPRESSION: 1. There is an suspicious right breast mass at 11:30. 2.  No evidence of right axillary lymphadenopathy. RECOMMENDATION: Ultrasound-guided biopsy is recommended for the right breast mass. This has been scheduled for 12/02/2016 at 2:45 p.m. I have discussed the findings and recommendations with the patient. Results were also provided in writing at the conclusion of the visit. If applicable, a reminder letter will be sent to the patient regarding the next appointment. BI-RADS CATEGORY  5: Highly suggestive of malignancy.  ASSESSMENT & PLAN:  62 y.o. postmenopausal female with allergies, anxiety, endometriosis presents with right breast cancer.   1.  Malignant neoplasm of upper outer quadrant of right breast, invasive ductal carcinoma, pT1cN0M0, stage IA, ER 100%, PR 60%, HER2 Negative, Oncotype RS 9 --I previously discussed her surgical path result in details -the Oncotype Dx result was reviewed with her in details. She has low risk based on the recurrence score, which predicts 10 year distant recurrence after 5 years of tamoxifen 6%.  He does not need adjuvant chemotherapy. -She is finishing adjuvant breast radiation soon, tolerated well so far. --Given the strong ER and PR positivity, I do recommend adjuvant aromatase inhibitor to reduce her risk of cancer recurrence.  -She started adjuvant RT with Dr. Lisbeth Renshaw on 02/14/17 and completed on 03/17/17 -Today, we had a lengthy conversation about the benefits of anti-estrogen therapy. She is concerned about side effects and states she does not like to take medication.I encouraged her to try it and if she has severe side effects we could always stop it. She would still like to think about it. I also discussed the benefit of changing her diet and exercising. She is agreeable. She knows to call if she would like to try Letrozole.  -She will continue  to f/u with her GYN, spacing out out visits so she can have breast exams twice a year.  -She is clinically doing well. Labs reviewed, her mild anemia has resolved and CMP is pending. Her physical exam was unremarkable. There is no sign of recurrence.  -She is due for a mammogram in Oct 2019, ordered today  -Survivorship clinic in 3-4 months -F/u in 8 months  2. Anemia -She is mildly anemic on CBC in January and august 2018, Hgb  11.9. She is asymptomatic, denies bleeding. She had colonoscopy with polypectomy in 09/2016, negative for malignancy. We will continue to monitor closely.  -resolved April 2019  3. Bone health  -She is unsure if she has ever had a DEXA. -I encouraged her to ask her GYN about this   PLAN -She declined adjuvant letrozole, although she may think about it again  -She is due for a mammogram in Oct 2019, ordered today  -Survivorship clinic in 3-4 months -F/u in 8 months  All questions were answered. The patient knows to call the clinic with any problems, questions or concerns. I spent 20 minutes counseling the patient face to face. The total time spent in the appointment was 25 minutes and more than 50% was on counseling, review of test results, and coordination of care.  This document serves as a record of services personally performed by Truitt Merle, MD. It was created on her behalf by Theresia Bough, a trained medical scribe. The creation of this record is based on the scribe's personal observations and the provider's statements to them.   I have reviewed the above documentation for accuracy and completeness, and I agree with the above.    Truitt Merle, MD 06/05/2017

## 2017-06-05 ENCOUNTER — Inpatient Hospital Stay: Payer: BLUE CROSS/BLUE SHIELD | Attending: Hematology | Admitting: Hematology

## 2017-06-05 ENCOUNTER — Inpatient Hospital Stay: Payer: BLUE CROSS/BLUE SHIELD

## 2017-06-05 ENCOUNTER — Telehealth: Payer: Self-pay | Admitting: Hematology

## 2017-06-05 ENCOUNTER — Encounter: Payer: Self-pay | Admitting: Hematology

## 2017-06-05 VITALS — BP 151/86 | HR 80 | Temp 98.7°F | Resp 18 | Ht 64.0 in | Wt 220.4 lb

## 2017-06-05 DIAGNOSIS — Z86010 Personal history of colonic polyps: Secondary | ICD-10-CM | POA: Insufficient documentation

## 2017-06-05 DIAGNOSIS — D649 Anemia, unspecified: Secondary | ICD-10-CM | POA: Insufficient documentation

## 2017-06-05 DIAGNOSIS — M5416 Radiculopathy, lumbar region: Secondary | ICD-10-CM | POA: Insufficient documentation

## 2017-06-05 DIAGNOSIS — R232 Flushing: Secondary | ICD-10-CM | POA: Diagnosis not present

## 2017-06-05 DIAGNOSIS — K8080 Other cholelithiasis without obstruction: Secondary | ICD-10-CM | POA: Diagnosis not present

## 2017-06-05 DIAGNOSIS — Z923 Personal history of irradiation: Secondary | ICD-10-CM | POA: Diagnosis not present

## 2017-06-05 DIAGNOSIS — F419 Anxiety disorder, unspecified: Secondary | ICD-10-CM | POA: Diagnosis not present

## 2017-06-05 DIAGNOSIS — Z87891 Personal history of nicotine dependence: Secondary | ICD-10-CM | POA: Diagnosis not present

## 2017-06-05 DIAGNOSIS — H8110 Benign paroxysmal vertigo, unspecified ear: Secondary | ICD-10-CM | POA: Insufficient documentation

## 2017-06-05 DIAGNOSIS — Z170 Estrogen receptor positive status [ER+]: Secondary | ICD-10-CM | POA: Diagnosis not present

## 2017-06-05 DIAGNOSIS — C50411 Malignant neoplasm of upper-outer quadrant of right female breast: Secondary | ICD-10-CM

## 2017-06-05 DIAGNOSIS — J029 Acute pharyngitis, unspecified: Secondary | ICD-10-CM | POA: Diagnosis not present

## 2017-06-05 LAB — CBC WITH DIFFERENTIAL (CANCER CENTER ONLY)
BASOS ABS: 0 10*3/uL (ref 0.0–0.1)
BASOS PCT: 1 %
Eosinophils Absolute: 0.2 10*3/uL (ref 0.0–0.5)
Eosinophils Relative: 3 %
HCT: 37 % (ref 34.8–46.6)
HEMOGLOBIN: 12 g/dL (ref 11.6–15.9)
LYMPHS PCT: 25 %
Lymphs Abs: 2 10*3/uL (ref 0.9–3.3)
MCH: 29.6 pg (ref 25.1–34.0)
MCHC: 32.4 g/dL (ref 31.5–36.0)
MCV: 91.1 fL (ref 79.5–101.0)
MONOS PCT: 6 %
Monocytes Absolute: 0.4 10*3/uL (ref 0.1–0.9)
NEUTROS ABS: 5.2 10*3/uL (ref 1.5–6.5)
NEUTROS PCT: 65 %
Platelet Count: 222 10*3/uL (ref 145–400)
RBC: 4.06 MIL/uL (ref 3.70–5.45)
RDW: 14.8 % — ABNORMAL HIGH (ref 11.2–14.5)
WBC Count: 7.8 10*3/uL (ref 3.9–10.3)

## 2017-06-05 LAB — CMP (CANCER CENTER ONLY)
ALT: 39 U/L (ref 0–55)
ANION GAP: 10 (ref 3–11)
AST: 25 U/L (ref 5–34)
Albumin: 4.3 g/dL (ref 3.5–5.0)
Alkaline Phosphatase: 103 U/L (ref 40–150)
BUN: 14 mg/dL (ref 7–26)
CALCIUM: 9.7 mg/dL (ref 8.4–10.4)
CO2: 23 mmol/L (ref 22–29)
Chloride: 107 mmol/L (ref 98–109)
Creatinine: 0.95 mg/dL (ref 0.60–1.10)
GFR, Estimated: >60 mL/min (ref >60)
Glucose, Bld: 154 mg/dL — ABNORMAL HIGH (ref 70–140)
POTASSIUM: 3.8 mmol/L (ref 3.5–5.1)
SODIUM: 140 mmol/L (ref 136–145)
TOTAL PROTEIN: 7.7 g/dL (ref 6.4–8.3)
Total Bilirubin: 0.8 mg/dL (ref 0.2–1.2)

## 2017-06-05 NOTE — Telephone Encounter (Signed)
Scheduled appt per 4/29 los - Gave patient AVS and calender per los.  

## 2017-09-27 ENCOUNTER — Telehealth: Payer: Self-pay

## 2017-09-27 NOTE — Telephone Encounter (Signed)
LVM for pt reminding of SCP visit with NP on 8/27 at 10 am.

## 2017-10-03 ENCOUNTER — Encounter: Payer: Self-pay | Admitting: Adult Health

## 2017-10-03 ENCOUNTER — Inpatient Hospital Stay: Payer: BLUE CROSS/BLUE SHIELD | Attending: Adult Health | Admitting: Adult Health

## 2017-10-03 VITALS — BP 148/71 | HR 75 | Temp 97.7°F | Resp 15 | Ht 64.0 in | Wt 210.2 lb

## 2017-10-03 DIAGNOSIS — Z923 Personal history of irradiation: Secondary | ICD-10-CM | POA: Insufficient documentation

## 2017-10-03 DIAGNOSIS — R232 Flushing: Secondary | ICD-10-CM | POA: Diagnosis not present

## 2017-10-03 DIAGNOSIS — Z87891 Personal history of nicotine dependence: Secondary | ICD-10-CM | POA: Insufficient documentation

## 2017-10-03 DIAGNOSIS — H8110 Benign paroxysmal vertigo, unspecified ear: Secondary | ICD-10-CM

## 2017-10-03 DIAGNOSIS — F419 Anxiety disorder, unspecified: Secondary | ICD-10-CM | POA: Insufficient documentation

## 2017-10-03 DIAGNOSIS — C50411 Malignant neoplasm of upper-outer quadrant of right female breast: Secondary | ICD-10-CM | POA: Insufficient documentation

## 2017-10-03 DIAGNOSIS — Z170 Estrogen receptor positive status [ER+]: Secondary | ICD-10-CM | POA: Diagnosis not present

## 2017-10-03 NOTE — Progress Notes (Signed)
CLINIC:  Survivorship   REASON FOR VISIT:  Routine follow-up post-treatment for a recent history of breast cancer.  BRIEF ONCOLOGIC HISTORY:  Oncology History   Cancer Staging Malignant neoplasm of upper-outer quadrant of right breast in female, estrogen receptor positive ([place]) Staging form: Breast, AJCC 8th Edition - Clinical: Stage IA (cT1c, cN0, cM0, G1, ER: Positive, PR: Negative, HER2: Negative) - Signed by Truitt Merle, MD on 12/16/2016 - Pathologic stage from 12/26/2016: Stage IA (pT1c, pN0, cM0, G1, ER: Positive, PR: Positive, HER2: Negative, Oncotype DX score: 9) - Signed by Truitt Merle, MD on 03/06/2017       Malignant neoplasm of upper-outer quadrant of right breast in female, estrogen receptor positive ([place])   11/25/2016 Mammogram    FINDINGS: In the upper-outer quadrant of the right breast there is an irregular spiculated mass in the posterior depth. No definite mammographic abnormalities are identified in the upper-outer quadrant as seen on the screening mammogram, however ultrasound will be performed for further evaluation.  Mammographic images were processed with CAD.  Ultrasound targeted to the right breast at 11:30, 4 cm from the nipple demonstrates an irregular hypoechoic mass measuring 1.3 x 1.0 x 1.1 cm. No other suspicious sonographic abnormalities are identified in the upper-outer quadrant. Multiple normal-appearing lymph nodes are identified in the right axilla.  IMPRESSION: 1. There is an suspicious right breast mass at 11:30.  2.  No evidence of right axillary lymphadenopathy.  RECOMMENDATION: Ultrasound-guided biopsy is recommended for the right breast mass. This has been scheduled for 12/02/2016 at 2:45 p.m.     11/25/2016 Breast US    FINDINGS: In the upper-outer quadrant of the right breast there is an irregular spiculated mass in the posterior depth. No definite mammographic abnormalities are identified in the upper-outer quadrant as  seen on the screening mammogram, however ultrasound will be performed for further evaluation.  Mammographic images were processed with CAD.  Ultrasound targeted to the right breast at 11:30, 4 cm from the nipple demonstrates an irregular hypoechoic mass measuring 1.3 x 1.0 x 1.1 cm. No other suspicious sonographic abnormalities are identified in the upper-outer quadrant. Multiple normal-appearing lymph nodes are identified in the right axilla.  IMPRESSION: 1. There is an suspicious right breast mass at 11:30.  2.  No evidence of right axillary lymphadenopathy.  RECOMMENDATION: Ultrasound-guided biopsy is recommended for the right breast mass. This has been scheduled for 12/02/2016 at 2:45 p.m.     12/02/2016 Procedure    Tissue Clip placement FINDINGS: Mammographic images were obtained following ultrasound guided biopsy of mass in the 11:30 o'clock location of the right breast. A ribbon shaped clip is identified within the spiculated mass in the upper-outer quadrant of the right breast.  IMPRESSION: Tissue marker clip in the expected location following biopsy.  Final Assessment: Post Procedure Mammograms for Marker Placement     12/02/2016 Initial Biopsy    Diagnosis Breast, right, needle core biopsy, upper outer, 11:30 o'clock - INVASIVE DUCTAL CARCINOMA - DUCTAL CARCINOMA IN SITU - SEE COMMENT  IMMUNOHISTOCHEMICAL AND MORPHOMETRIC ANALYSIS PERFORMED MANUALLY Estrogen Receptor: 100%, POSITIVE, STRONG STAINING INTENSITY Progesterone Receptor: 60%, POSITIVE, STRONG STAINING INTENSITY Proliferation Marker Ki67: 5% HER2 - Negative    12/02/2016 Initial Diagnosis    Malignant neoplasm of upper-outer quadrant of right breast in female, estrogen receptor positive ([place])    12/02/2016 Receptors her2    HER2 - NEGATIVE RATIO OF HER2/CEP17 SIGNALS 1.00 AVERAGE HER2 COPY NUMBER PER CELL 1.80  Estrogen Receptor: 100%, POSITIVE, STRONG STAINING  INTENSITY Progesterone Receptor: 60%, POSITIVE, STRONG STAINING INTENSITY Proliferation Marker Ki67: 5%    12/26/2016 Oncotype testing    RS 9, which predicts 10-year risk of distant recurrence 6% with tamoxifen alone. Low risk disease     12/26/2016 Pathology Results    Diagnosis 12/26/16 1. 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 Lymph node, sentinel, biopsy, right axillary ONE BENIGN LYMPH NODE (0/1) 3. Lymph node, sentinel, biopsy, right ONE BENIGN LYMPH NODE (0/1) 4. Lymph node, sentinel, biopsy, right ONE BENIGN LYMPH NODE (0/1) 5. Breast, excision, right additional superior margin BENIGN BREAST TISSUE NEGATIVE FOR CARCINOMA     12/26/2016 Surgery    Right Breast lumpectomy with sentinel Node Biopsy    02/14/2017 - 03/17/2017 Radiation Therapy    Radiation therapy with Dr. Lisbeth Renshaw     INTERVAL HISTORY:  Ms. Couchman presents to the [facility] today for our initial meeting to review her survivorship care plan detailing her treatment course for breast cancer, as well as monitoring long-term side effects of that treatment, education regarding health maintenance, screening, and overall wellness and health promotion.     Overall, Ms. Dismore reports feeling quite well.  She does note two issues she wants to discuss.  She notes that after her breast cancer she had erythema on her breast at the end of the night.  She believes it improved.  Now, when she takes off her bra, her right breast is red like a sunburn, and there is a warmth in the right breast.  About 3 weeks ago she felt underneath her breast and she noted a ridging at the end of the breast.  Last night she didn't feel it.  She does note that over the summer she has helped with building her own house.  It is 2300 square feet.    Her only symptoms prior to breast cancer were  fatigue and hot flashes.  She has noted a recurrence in these symptoms.  She says the hot flashes have slightly improved.     REVIEW OF SYSTEMS:  Review of Systems  Constitutional: Negative for appetite change, chills, fatigue and unexpected weight change.  HENT:   Negative for hearing loss, lump/mass and trouble swallowing.   Eyes: Negative for eye problems and icterus.  Respiratory: Negative for chest tightness, cough and shortness of breath.   Cardiovascular: Negative for chest pain, leg swelling and palpitations.  Gastrointestinal: Negative for abdominal distention, abdominal pain, constipation, diarrhea, nausea and vomiting.  Endocrine: Negative for hot flashes.  Skin: Negative for itching and rash.  Neurological: Negative for dizziness, extremity weakness and numbness.  Hematological: Negative for adenopathy. Does not bruise/bleed easily.  Psychiatric/Behavioral: Negative for depression. The patient is not nervous/anxious.    Breast: Denies any new nodularity, masses, tenderness, nipple changes, or nipple discharge.      ONCOLOGY TREATMENT TEAM:  1. Surgeon:  Dr. Lucia Gaskins at [facility] 2. Medical Oncologist: Dr. Burr Medico  3. Radiation Oncologist: Dr. Lisbeth Renshaw    PAST MEDICAL/SURGICAL HISTORY:  Past Medical History:  Diagnosis Date  . Anxiety   . BPPV (benign paroxysmal positional vertigo)   . Breast cancer ([place]) 12/02/2016   right breast  . Chronic headaches   . Complication of anesthesia    Hard to wake up  . Endometriosis    "went in and cleaned"  . Gall stones   . Lumbar  radiculopathy   . Palpitations   . PONV (postoperative nausea and vomiting)    Nausea without vomiting  . Varicose veins    Past Surgical History:  Procedure Laterality Date  . BACK SURGERY  2014  . BREAST LUMPECTOMY WITH RADIOACTIVE SEED AND SENTINEL LYMPH NODE BIOPSY Right 12/26/2016   Procedure: BREAST LUMPECTOMY WITH RADIOACTIVE SEED AND RIGHT AXILLARY SENTINEL LYMPH NODE BIOPSY;   Surgeon: Alphonsa Overall, MD;  Location: [facility];  Service: General;  Laterality: Right;  . CERVICAL POLYPECTOMY    . COLONOSCOPY W/ POLYPECTOMY    . TONSILLECTOMY AND ADENOIDECTOMY     as child     ALLERGIES:  No Known Allergies   CURRENT MEDICATIONS:  No outpatient encounter medications on file as of 10/03/2017.   No facility-administered encounter medications on file as of 10/03/2017.      ONCOLOGIC FAMILY HISTORY:  Family History  Problem Relation Age of Onset  . Alzheimer's disease Mother   . Hypertension Sister   . Hypertension Brother      GENETIC COUNSELING/TESTING: Not at this time  SOCIAL HISTORY:  Social History   Socioeconomic History  . Marital status: Married    Spouse name: Marcello Moores  . Number of children: 3  . Years of education: 52  . Highest education level: Not on file  Occupational History    Comment: works from home  Social Needs  . Financial resource strain: Not on file  . Food insecurity:    Worry: Not on file    Inability: Not on file  . Transportation needs:    Medical: Not on file    Non-medical: Not on file  Tobacco Use  . Smoking status: Former Smoker    Packs/day: 0.50    Years: 15.00    Pack years: 7.50    Types: Cigarettes    Last attempt to quit: 03/08/1988    Years since quitting: 29.5  . Smokeless tobacco: Never Used  Substance and Sexual Activity  . Alcohol use: No    Alcohol/week: 0.0 standard drinks  . Drug use: No  . Sexual activity: Not on file  Lifestyle  . Physical activity:    Days per week: Not on file    Minutes per session: Not on file  . Stress: Not on file  Relationships  . Social connections:    Talks on phone: Not on file    Gets together: Not on file    Attends religious service: Not on file    Active member of club or organization: Not on file    Attends meetings of clubs or organizations: Not on file    Relationship status: Not on file  . Intimate partner violence:    Fear of current or ex partner:  Not on file    Emotionally abused: Not on file    Physically abused: Not on file    Forced sexual activity: Not on file  Other Topics Concern  . Not on file  Social History Narrative   Lives at home with husband, child   Caffeine use- tea once a week      PHYSICAL EXAMINATION:  Vital Signs:   Vitals:   10/03/17 0946  BP: (!) 148/71  Pulse: 75  Resp: 15  Temp: 97.7 F (36.5 C)  SpO2: 100%   Filed Weights   10/03/17 0946  Weight: 210 lb 3.2 oz (95.3 kg)   General: Well-nourished, well-appearing female in no acute distress.  She is unaccompanied today.   HEENT: Head  is normocephalic.  Pupils equal and reactive to light. Conjunctivae clear without exudate.  Sclerae anicteric. Oral mucosa is pink, moist.  Oropharynx is pink without lesions or erythema.  Lymph: No cervical, supraclavicular, or infraclavicular lymphadenopathy noted on palpation.  Cardiovascular: Regular rate and rhythm.Marland Kitchen Respiratory: Clear to auscultation bilaterally. Chest expansion symmetric; breathing non-labored.  Breast: right breast without nodules, masses, noted, lumpectomy site well healed, mild amount of swelling present, left breast without nodules, masses, skin or nipple changes GI: Abdomen soft and round; non-tender, non-distended. Bowel sounds normoactive.  GU: Deferred.  Neuro: No focal deficits. Steady gait.  Psych: Mood and affect normal and appropriate for situation.  Extremities: No edema. MSK: No focal spinal tenderness to palpation.  Full range of motion in bilateral upper extremities Skin: Warm and dry.  LABORATORY DATA:  None for this visit.  DIAGNOSTIC IMAGING:  None for this visit.      ASSESSMENT AND PLAN:  Ms.. Ottaway is a pleasant 62 y.o. female with Stage IA right breast invasive ductal carcinoma, ER+/PR+/HER2-, diagnosed in 11/2016, treated with lumpectomy, adjuvant radiation therapy (declined anti estrogen therapy).  She presents to the Survivorship Clinic for our initial  meeting and routine follow-up post-completion of treatment for breast cancer.   1. Stage IA right breast cancer:  Ms. Simonet is continuing to recover from definitive treatment for breast cancer. She will follow-up with her medical oncologist, Dr. Burr Medico in 01/2018 with history and physical exam per surveillance protocol.  Today, a comprehensive survivorship care plan and treatment summary was reviewed with the patient today detailing her breast cancer diagnosis, treatment course, potential late/long-term effects of treatment, appropriate follow-up care with recommendations for the future, and patient education resources.  A copy of this summary, along with a letter will be sent to the patient's primary care provider via mail/fax/In Basket message after today's visit.    2. Breast concern: Due to the change she noted in her breast, we will repeat ultrasound and mammogram.  I counseled her that I thought it certainly could be related to the increased activity she was under with building her own house herself.   3. Bone health:  Given Ms. Acy's age/history of breast cancer she is at risk for bone demineralization.  I will defer to her PCP regarding future bone density testing. She was given education on specific activities to promote bone health.  4. Cancer screening:  Due to Ms. Dupuy's history and her age, she should receive screening for skin cancers, colon cancer, and gynecologic cancers.  The information and recommendations are listed on the patient's comprehensive care plan/treatment summary and were reviewed in detail with the patient.    5. Health maintenance and wellness promotion: Ms. Shankland was encouraged to consume 5-7 servings of fruits and vegetables per day. We reviewed the "Nutrition Rainbow" handout, as well as the handout "Take Control of Your Health and Reduce Your Cancer Risk" from the [employer] [place].  She was also encouraged to engage in moderate to vigorous exercise for 30 minutes  per day most days of the week. We discussed the [employer] fitness program, which is designed for cancer survivors to help them become more physically fit after cancer treatments.  She was instructed to limit her alcohol consumption and continue to abstain from tobacco use.     5. Support services/counseling: It is not uncommon for this period of the patient's cancer care trajectory to be one of many emotions and stressors.  We discussed an opportunity for her  to participate in the next session of [facility] ("Finding Your New Normal") support group series designed for patients after they have completed treatment.   Ms. Wiechman was encouraged to take advantage of our many other support services programs, support groups, and/or counseling in coping with her new life as a cancer survivor after completing anti-cancer treatment.  She was given information regarding our available services and encouraged to contact me with any questions or for help enrolling in any of our support group/programs.    Dispo:   -Return to cancer center in 01/2018 for f/u with Dr. Burr Medico -Mammogram due in 11/2017 -Follow up with surgery per Dr. Lucia Gaskins -She is welcome to return back to the Survivorship Clinic at any time; no additional follow-up needed at this time.  -Consider referral back to survivorship as a long-term survivor for continued surveillance  A total of (30) minutes of face-to-face time was spent with this patient with greater than 50% of that time in counseling and care-coordination.   Gardenia Phlegm, NP Survivorship Program [place] [phone]   Note: PRIMARY CARE PROVIDER [place], [place] At [phone]

## 2017-10-05 ENCOUNTER — Telehealth: Payer: Self-pay | Admitting: Hematology

## 2017-10-05 NOTE — Telephone Encounter (Signed)
No LOS 8/27

## 2017-11-27 ENCOUNTER — Other Ambulatory Visit: Payer: BLUE CROSS/BLUE SHIELD

## 2017-11-29 ENCOUNTER — Ambulatory Visit: Admission: RE | Admit: 2017-11-29 | Payer: BLUE CROSS/BLUE SHIELD | Source: Ambulatory Visit

## 2017-11-29 ENCOUNTER — Ambulatory Visit
Admission: RE | Admit: 2017-11-29 | Discharge: 2017-11-29 | Disposition: A | Discharge status: Home / Self Care | Payer: BLUE CROSS/BLUE SHIELD | Source: Ambulatory Visit | Attending: Adult Health | Admitting: Adult Health

## 2017-11-29 DIAGNOSIS — Z170 Estrogen receptor positive status [ER+]: Principal | ICD-10-CM

## 2017-11-29 DIAGNOSIS — C50411 Malignant neoplasm of upper-outer quadrant of right female breast: Secondary | ICD-10-CM

## 2017-11-29 HISTORY — DX: Personal history of irradiation: Z92.3

## 2018-02-01 NOTE — Progress Notes (Signed)
[place]   [phone] [phone]   Clinic Follow up Note   Patient Care Team: [name] as PCP - General (Family Medicine) Truitt Merle, MD as Consulting Physician (Hematology) Kyung Rudd, MD as Consulting Physician (Radiation Oncology) Richmond Campbell, MD as Consulting Physician (Gastroenterology) Louretta Shorten, MD as Consulting Physician (Obstetrics and Gynecology) Alphonsa Overall, MD as Consulting Physician (General Surgery) Delice Bison, Charlestine Massed, NP as Nurse Practitioner (Hematology and Oncology)  Date of Service:  02/02/2018  CHIEF COMPLAINT: F/u in right breast cancer  SUMMARY OF ONCOLOGIC HISTORY: Oncology History   Cancer Staging Malignant neoplasm of upper-outer quadrant of right breast in female, estrogen receptor positive ([place]) Staging form: Breast, AJCC 8th Edition - Clinical: Stage IA (cT1c, cN0, cM0, G1, ER: Positive, PR: Negative, HER2: Negative) - Signed by Truitt Merle, MD on 12/16/2016 - Pathologic stage from 12/26/2016: Stage IA (pT1c, pN0, cM0, G1, ER: Positive, PR: Positive, HER2: Negative, Oncotype DX score: 9) - Signed by Truitt Merle, MD on 03/06/2017       Malignant neoplasm of upper-outer quadrant of right breast in female, estrogen receptor positive ([place])   11/25/2016 Mammogram    FINDINGS: In the upper-outer quadrant of the right breast there is an irregular spiculated mass in the posterior depth. No definite mammographic abnormalities are identified in the upper-outer quadrant as seen on the screening mammogram, however ultrasound will be performed for further evaluation.  Mammographic images were processed with CAD.  Ultrasound targeted to the right breast at 11:30, 4 cm from the nipple demonstrates an irregular hypoechoic mass measuring 1.3 x 1.0 x 1.1 cm. No other suspicious sonographic abnormalities are identified in the upper-outer quadrant. Multiple normal-appearing lymph  nodes are identified in the right axilla.  IMPRESSION: 1. There is an suspicious right breast mass at 11:30.  2.  No evidence of right axillary lymphadenopathy.  RECOMMENDATION: Ultrasound-guided biopsy is recommended for the right breast mass. This has been scheduled for 12/02/2016 at 2:45 p.m.     11/25/2016 Breast US    FINDINGS: In the upper-outer quadrant of the right breast there is an irregular spiculated mass in the posterior depth. No definite mammographic abnormalities are identified in the upper-outer quadrant as seen on the screening mammogram, however ultrasound will be performed for further evaluation.  Mammographic images were processed with CAD.  Ultrasound targeted to the right breast at 11:30, 4 cm from the nipple demonstrates an irregular hypoechoic mass measuring 1.3 x 1.0 x 1.1 cm. No other suspicious sonographic abnormalities are identified in the upper-outer quadrant. Multiple normal-appearing lymph nodes are identified in the right axilla.  IMPRESSION: 1. There is an suspicious right breast mass at 11:30.  2.  No evidence of right axillary lymphadenopathy.  RECOMMENDATION: Ultrasound-guided biopsy is recommended for the right breast mass. This has been scheduled for 12/02/2016 at 2:45 p.m.     12/02/2016 Procedure    Tissue Clip placement FINDINGS: Mammographic images were obtained following ultrasound guided biopsy of mass in the 11:30 o'clock location of the right breast. A ribbon shaped clip is identified within the spiculated mass in the upper-outer quadrant of the right breast.  IMPRESSION: Tissue marker clip in the expected location following biopsy.  Final Assessment: Post Procedure Mammograms for Marker Placement     12/02/2016 Initial Biopsy    Diagnosis Breast, right, needle core biopsy, upper outer, 11:30 o'clock - INVASIVE DUCTAL CARCINOMA - DUCTAL CARCINOMA IN SITU - SEE COMMENT  IMMUNOHISTOCHEMICAL AND  MORPHOMETRIC ANALYSIS PERFORMED  MANUALLY Estrogen Receptor: 100%, POSITIVE, STRONG STAINING INTENSITY Progesterone Receptor: 60%, POSITIVE, STRONG STAINING INTENSITY Proliferation Marker Ki67: 5% HER2 - Negative    12/02/2016 Initial Diagnosis    Malignant neoplasm of upper-outer quadrant of right breast in female, estrogen receptor positive ([place])    12/02/2016 Receptors her2    HER2 - NEGATIVE RATIO OF HER2/CEP17 SIGNALS 1.00 AVERAGE HER2 COPY NUMBER PER CELL 1.80  Estrogen Receptor: 100%, POSITIVE, STRONG STAINING INTENSITY Progesterone Receptor: 60%, POSITIVE, STRONG STAINING INTENSITY Proliferation Marker Ki67: 5%    12/26/2016 Oncotype testing    RS 9, which predicts 10-year risk of distant recurrence 6% with tamoxifen alone. Low risk disease     12/26/2016 Pathology Results    Diagnosis 12/26/16 1. 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 Lymph node, sentinel, biopsy, right axillary ONE BENIGN LYMPH NODE (0/1) 3. Lymph node, sentinel, biopsy, right ONE BENIGN LYMPH NODE (0/1) 4. Lymph node, sentinel, biopsy, right ONE BENIGN LYMPH NODE (0/1) 5. Breast, excision, right additional superior margin BENIGN BREAST TISSUE NEGATIVE FOR CARCINOMA     12/26/2016 Surgery    Right Breast lumpectomy with sentinel Node Biopsy    02/14/2017 - 03/17/2017 Radiation Therapy    Radiation therapy with Dr. Lisbeth Renshaw      CURRENT THERAPY:  Observation  INTERVAL HISTORY:  Rhonda Cooley is here for a follow up of right breast cancer. She was last seen by me 8 months ago. In interim she was seen by NP Jamelle Haring for Survivorship clinic 4 months ago.  She presents to the clinic today by herself. She notes she is doing well with no pain or new changes. She will f//u with her Gyn next week. She is willing to try biotin supplement.      REVIEW OF  SYSTEMS:   Constitutional: Denies fevers, chills or abnormal weight loss Eyes: Denies blurriness of vision Ears, nose, mouth, throat, and face: Denies mucositis or sore throat Respiratory: Denies cough, dyspnea or wheezes Cardiovascular: Denies palpitation, chest discomfort or lower extremity swelling Gastrointestinal:  Denies nausea, heartburn or change in bowel habits Skin: Denies abnormal skin rashes (+) brittle nails Lymphatics: Denies new lymphadenopathy or easy bruising Neurological:Denies numbness, tingling or new weaknesses Behavioral/Psych: Mood is stable, no new changes  All other systems were reviewed with the patient and are negative.  MEDICAL HISTORY:  Past Medical History:  Diagnosis Date  . Anxiety   . BPPV (benign paroxysmal positional vertigo)   . Breast cancer ([place]) 12/02/2016   right breast  . Chronic headaches   . Complication of anesthesia    Hard to wake up  . Endometriosis    "went in and cleaned"  . Gall stones   . Lumbar radiculopathy   . Palpitations   . Personal history of radiation therapy   . PONV (postoperative nausea and vomiting)    Nausea without vomiting  . Varicose veins     SURGICAL HISTORY: Past Surgical History:  Procedure Laterality Date  . BACK SURGERY  2014  . BREAST LUMPECTOMY    . BREAST LUMPECTOMY WITH RADIOACTIVE SEED AND SENTINEL LYMPH NODE BIOPSY Right 12/26/2016   Procedure: BREAST LUMPECTOMY WITH RADIOACTIVE SEED AND RIGHT AXILLARY SENTINEL LYMPH NODE BIOPSY;  Surgeon: Alphonsa Overall, MD;  Location: [facility];  Service: General;  Laterality: Right;  . CERVICAL POLYPECTOMY    . COLONOSCOPY W/ POLYPECTOMY    .  TONSILLECTOMY AND ADENOIDECTOMY     as child    I have reviewed the social history and family history with the patient and they are unchanged from previous note.  ALLERGIES:  has No Known Allergies.  MEDICATIONS:  No current outpatient medications on file.   No current facility-administered medications for this  visit.     PHYSICAL EXAMINATION: ECOG PERFORMANCE STATUS: 0 - Asymptomatic  Vitals:   02/02/18 1106  BP: (!) 144/76  Pulse: 88  Resp: 20  Temp: 98.1 F (36.7 C)  SpO2: 98%   Filed Weights   02/02/18 1106  Weight: 217 lb 4.8 oz (98.6 kg)    GENERAL:alert, no distress and comfortable SKIN: skin color, texture, turgor are normal, no rashes or significant lesions EYES: normal, Conjunctiva are pink and non-injected, sclera clear OROPHARYNX:no exudate, no erythema and lips, buccal mucosa, and tongue normal  NECK: supple, thyroid normal size, non-tender, without nodularity LYMPH:  no palpable lymphadenopathy in the cervical, axillary or inguinal LUNGS: clear to auscultation and percussion with normal breathing effort HEART: regular rate & rhythm and no murmurs and no lower extremity edema ABDOMEN:abdomen soft, non-tender and normal bowel sounds Musculoskeletal:no cyanosis of digits and no clubbing  NEURO: alert & oriented x 3 with fluent speech, no focal motor/sensory deficits BREAST: S/p right breast lumpectomy: surgical incision healed well. Benign exam  LABORATORY DATA:  I have reviewed the data as listed CBC Latest Ref Rng & Units 02/02/2018 06/05/2017 12/23/2016  WBC 4.0 - 10.5 K/uL 8.0 7.8 8.9  Hemoglobin 12.0 - 15.0 g/dL 12.1 12.0 11.8(L)  Hematocrit 36.0 - 46.0 % 37.6 37.0 35.8(L)  Platelets 150 - 400 K/uL 236 222 249     CMP Latest Ref Rng & Units 02/02/2018 06/05/2017 12/23/2016  Glucose 70 - 99 mg/dL 144(H) 154(H) 154(H)  BUN 8 - 23 mg/dL _0 Creatinine 0.44 - 1.00 mg/dL 1.00 0.95 0.74  Sodium 135 - 145 mmol/L 140 140 138  Potassium 3.5 - 5.1 mmol/L 4.6 3.8 4.0  Chloride 98 - 111 mmol/L 106 107 108  CO2 22 - 32 mmol/L _1 Calcium 8.9 - 10.3 mg/dL 9.7 9.7 9.2  Total Protein 6.5 - 8.1 g/dL 7.6 7.7 -  Total Bilirubin 0.3 - 1.2 mg/dL 1.3(H) 0.8 -  Alkaline Phos 38 - 126 U/L 120 103 -  AST 15 - 41 U/L 46(H) 25 -  ALT 0 - 44 U/L 80(H) 39 -       RADIOGRAPHIC STUDIES: I have personally reviewed the radiological images as listed and agreed with the findings in the report. No results found.   ASSESSMENT & PLAN:  Rhonda Cooley is a 62 y.o. female with   1.  Malignant neoplasm of upper outer quadrant of right breast, invasive ductal carcinoma, pT1cN0M0, stage IA, ER 100%, PR 60%, HER2 Negative, Oncotype RS 9 -She was diagnosed in 11/2016. She is s/p right breast lumpectomy and radiation therapy. Although her cancer was ER/PR positive, she declined antiestrogen therapy due to concerns of the side effects.  -She is low risk for recurrence based on Oncotype, so we will continue with breast surveillance with regular  Exam, labs and annual mammograms.  -She is clinically doing well. Lab reviewed, her CBC and CMP are within normal limits except glucose 144 and mildly elevated LFTs. Her physical exam and her 11/2017 mammogram were unremarkable. There is no clinical concern for recurrence. -Next mammogram in 11/2018, order at next visit  -F/u in 6 months  2. Bone health  -Will continue DEXA scan every 2 years   3. Borderline Diabetic -Glucose has been elevated in 140-150 range for over a year.  -I discussed she is likely borderline diabetic and encouraged her to work on losing weight and control her diet and sugar in take.  -Glucose at 144 today (02/02/18)  4. Transmantiis -She has slightly elevated LFTs liekly secondary to fatty liver -She was seen to have Fatty liver on prior scans.  -I recommend she get tested for Hep B and C to rule out infection. She will test with her Gyn.    PLAN -She is clincally doing well, will continue surveillance  -Lab and f/u with Lacie in 6 months  -she will see her GYN in a few months     No problem-specific Assessment & Plan notes found for this encounter.   No orders of the defined types were placed in this encounter.  All questions were answered. The patient knows to call the  clinic with any problems, questions or concerns. No barriers to learning was detected. I spent 15 minutes counseling the patient face to face. The total time spent in the appointment was 20 minutes and more than 50% was on counseling and review of test results     Truitt Merle, MD 02/02/2018   I, Joslyn Devon, am acting as scribe for Truitt Merle, MD.   I have reviewed the above documentation for accuracy and completeness, and I agree with the above.

## 2018-02-02 ENCOUNTER — Inpatient Hospital Stay: Payer: BLUE CROSS/BLUE SHIELD

## 2018-02-02 ENCOUNTER — Inpatient Hospital Stay: Payer: BLUE CROSS/BLUE SHIELD | Attending: Hematology | Admitting: Hematology

## 2018-02-02 ENCOUNTER — Telehealth: Payer: Self-pay

## 2018-02-02 VITALS — BP 144/76 | HR 88 | Temp 98.1°F | Resp 20 | Ht 64.0 in | Wt 217.3 lb

## 2018-02-02 DIAGNOSIS — Z170 Estrogen receptor positive status [ER+]: Secondary | ICD-10-CM | POA: Insufficient documentation

## 2018-02-02 DIAGNOSIS — F419 Anxiety disorder, unspecified: Secondary | ICD-10-CM | POA: Insufficient documentation

## 2018-02-02 DIAGNOSIS — R7989 Other specified abnormal findings of blood chemistry: Secondary | ICD-10-CM | POA: Diagnosis not present

## 2018-02-02 DIAGNOSIS — C50411 Malignant neoplasm of upper-outer quadrant of right female breast: Secondary | ICD-10-CM | POA: Diagnosis not present

## 2018-02-02 DIAGNOSIS — Z923 Personal history of irradiation: Secondary | ICD-10-CM | POA: Insufficient documentation

## 2018-02-02 DIAGNOSIS — K760 Fatty (change of) liver, not elsewhere classified: Secondary | ICD-10-CM | POA: Insufficient documentation

## 2018-02-02 LAB — CBC WITH DIFFERENTIAL (CANCER CENTER ONLY)
Abs Immature Granulocytes: 0.07 10*3/uL (ref 0.00–0.07)
BASOS PCT: 1 %
Basophils Absolute: 0.1 10*3/uL (ref 0.0–0.1)
EOS PCT: 2 %
Eosinophils Absolute: 0.2 10*3/uL (ref 0.0–0.5)
HCT: 37.6 % (ref 36.0–46.0)
Hemoglobin: 12.1 g/dL (ref 12.0–15.0)
Immature Granulocytes: 1 %
Lymphocytes Relative: 23 %
Lymphs Abs: 1.8 10*3/uL (ref 0.7–4.0)
MCH: 28.9 pg (ref 26.0–34.0)
MCHC: 32.2 g/dL (ref 30.0–36.0)
MCV: 90 fL (ref 80.0–100.0)
MONO ABS: 0.5 10*3/uL (ref 0.1–1.0)
Monocytes Relative: 7 %
Neutro Abs: 5.4 10*3/uL (ref 1.7–7.7)
Neutrophils Relative %: 66 %
PLATELETS: 236 10*3/uL (ref 150–400)
RBC: 4.18 MIL/uL (ref 3.87–5.11)
RDW: 13.9 % (ref 11.5–15.5)
WBC: 8 10*3/uL (ref 4.0–10.5)
nRBC: 0 % (ref 0.0–0.2)

## 2018-02-02 LAB — CMP (CANCER CENTER ONLY)
ALT: 80 U/L — AB (ref 0–44)
AST: 46 U/L — AB (ref 15–41)
Albumin: 4.3 g/dL (ref 3.5–5.0)
Alkaline Phosphatase: 120 U/L (ref 38–126)
Anion gap: 8 (ref 5–15)
BILIRUBIN TOTAL: 1.3 mg/dL — AB (ref 0.3–1.2)
BUN: 14 mg/dL (ref 8–23)
CO2: 26 mmol/L (ref 22–32)
CREATININE: 1 mg/dL (ref 0.44–1.00)
Calcium: 9.7 mg/dL (ref 8.9–10.3)
Chloride: 106 mmol/L (ref 98–111)
GFR, Est AFR Am: >60 mL/min (ref >60)
Glucose, Bld: 144 mg/dL — ABNORMAL HIGH (ref 70–99)
POTASSIUM: 4.6 mmol/L (ref 3.5–5.1)
Sodium: 140 mmol/L (ref 135–145)
TOTAL PROTEIN: 7.6 g/dL (ref 6.5–8.1)

## 2018-02-02 NOTE — Telephone Encounter (Signed)
Printed avas and calender of upcoming appointment. Per 12/27 los

## 2018-02-03 ENCOUNTER — Encounter: Payer: Self-pay | Admitting: Hematology

## 2018-06-11 ENCOUNTER — Telehealth: Payer: Self-pay | Admitting: Hematology

## 2018-06-11 NOTE — Telephone Encounter (Signed)
Lab/fu 6/26 moved to 6/25. Left message. Scheduled mailed.

## 2018-07-30 ENCOUNTER — Telehealth: Payer: Self-pay | Admitting: Nurse Practitioner

## 2018-07-30 NOTE — Telephone Encounter (Signed)
Returned patient's phone call regarding rescheduling 06/25 appointments. Appointments have now been rescheduled for 06/29.

## 2018-08-02 ENCOUNTER — Other Ambulatory Visit: Payer: BLUE CROSS/BLUE SHIELD

## 2018-08-02 ENCOUNTER — Ambulatory Visit: Payer: BLUE CROSS/BLUE SHIELD | Admitting: Nurse Practitioner

## 2018-08-03 ENCOUNTER — Other Ambulatory Visit: Payer: BLUE CROSS/BLUE SHIELD

## 2018-08-03 ENCOUNTER — Ambulatory Visit: Payer: BLUE CROSS/BLUE SHIELD | Admitting: Nurse Practitioner

## 2018-08-06 ENCOUNTER — Inpatient Hospital Stay: Payer: BC Managed Care – PPO | Admitting: Nurse Practitioner

## 2018-08-06 ENCOUNTER — Inpatient Hospital Stay: Payer: BC Managed Care – PPO

## 2018-10-18 ENCOUNTER — Telehealth: Payer: Self-pay

## 2018-10-18 NOTE — Telephone Encounter (Signed)
Pt aware and will make appt °

## 2018-10-18 NOTE — Telephone Encounter (Signed)
Pt called stating that last night she was laying on her L side and she felt like her heart felt "achy" sort of a cramp in her heart; She turned to the other side and and felt a little better but she can still feel it; She also got really cold which is unusual for her; She took a ASA and a hour later she felt like it calmed down; She says a week or so ago she had been having palps that went all the way up to her throat;  She needs a appt im just not sure how urgent?

## 2018-11-02 ENCOUNTER — Ambulatory Visit: Payer: Self-pay | Admitting: Cardiology

## 2018-11-15 ENCOUNTER — Ambulatory Visit: Payer: Self-pay | Admitting: Cardiology

## 2019-01-17 ENCOUNTER — Other Ambulatory Visit: Payer: Self-pay | Admitting: Adult Health

## 2019-01-17 DIAGNOSIS — Z853 Personal history of malignant neoplasm of breast: Secondary | ICD-10-CM

## 2019-01-23 ENCOUNTER — Ambulatory Visit
Admission: RE | Admit: 2019-01-23 | Discharge: 2019-01-23 | Disposition: A | Discharge status: Home / Self Care | Payer: BC Managed Care – PPO | Source: Ambulatory Visit | Attending: Adult Health | Admitting: Adult Health

## 2019-01-23 ENCOUNTER — Other Ambulatory Visit: Payer: Self-pay

## 2019-01-23 DIAGNOSIS — Z853 Personal history of malignant neoplasm of breast: Secondary | ICD-10-CM

## 2019-06-03 NOTE — Progress Notes (Signed)
Primary Physician/Referring:  [facility]  Patient ID: Rhonda Cooley, female    DOB: July 15, 1955, 64 y.o.   MRN: [id number]  Chief Complaint  Patient presents with  . Follow-up  . Palpitations   HPI:    Rhonda Cooley  is a 64 y.o. Caucasian female with history of hyperlipidemia, hyperglycemia, and breast cancer s/p right breast lumpectomy in 2018 S/P RT and did not want maintenance oral chemotherapy who presents for evaluation of new onset of worsening palpitations for the past 3 weeks, worsening exertional dyspnea over the past several months and also episodes of chest tightness with or without activity lasting anywhere from few minutes to several minutes.  History is vague.  Past medical history significant for hyperlipidemia, elevated blood pressure without diagnosis of hypertension, former history of tobacco use.  Patient has noticed marked limitation in activity especially for the past 3 weeks, doing activities during her work she gets markedly dyspneic, fatigued and also has noticed occasional episodes of chest pain and also palpitations.  She continues to have episodes of palpitations especially at night, described as skipped beats to rapid heartbeats lasting several seconds.  Sometimes feels very hard and sometimes feels light.  Past Medical History:  Diagnosis Date  . Anxiety   . BPPV (benign paroxysmal positional vertigo)   . Breast cancer ([place]) 12/02/2016   right breast  . Chronic headaches   . Complication of anesthesia    Hard to wake up  . Endometriosis    "went in and cleaned"  . Gall stones   . Lumbar radiculopathy   . Palpitations   . Personal history of radiation therapy   . PONV (postoperative nausea and vomiting)    Nausea without vomiting  . Varicose veins    Past Surgical History:  Procedure Laterality Date  . BACK SURGERY  2014  . BREAST LUMPECTOMY    . BREAST LUMPECTOMY WITH RADIOACTIVE SEED AND SENTINEL LYMPH NODE BIOPSY  Right 12/26/2016   Procedure: BREAST LUMPECTOMY WITH RADIOACTIVE SEED AND RIGHT AXILLARY SENTINEL LYMPH NODE BIOPSY;  Surgeon: Alphonsa Overall, MD;  Location: [facility];  Service: General;  Laterality: Right;  . CERVICAL POLYPECTOMY    . COLONOSCOPY W/ POLYPECTOMY    . TONSILLECTOMY AND ADENOIDECTOMY     as child   Family History  Problem Relation Age of Onset  . Alzheimer's disease Mother   . Hypertension Sister   . Hypertension Brother     Social History   Tobacco Use  . Smoking status: Former Smoker    Packs/day: 0.50    Years: 15.00    Pack years: 7.50    Types: Cigarettes    Quit date: 03/08/1988    Years since quitting: 31.2  . Smokeless tobacco: Never Used  Substance Use Topics  . Alcohol use: No    Alcohol/week: 0.0 standard drinks   Marital Status: Married  ROS  Review of Systems  Constitution: Positive for malaise/fatigue. Negative for chills, decreased appetite and weight gain.       Morbidly Obese   Cardiovascular: Positive for palpitations. Negative for dyspnea on exertion, leg swelling and syncope.  Endocrine: Positive for heat intolerance (and hot flashes). Negative for cold intolerance.  Hematologic/Lymphatic: Does not bruise/bleed easily.  Musculoskeletal: Negative for joint swelling.  Gastrointestinal: Negative for abdominal pain, anorexia, change in bowel habit, hematochezia and melena.  Neurological: Positive for dizziness. Negative for headaches and light-headedness.  Psychiatric/Behavioral: Negative for depression and substance abuse.   Objective  Blood pressure Marland Kitchen)  173/84, pulse 100, temperature 98.3 F (36.8 C), temperature source Temporal, height '5\' 4"'$  (1.626 m), weight 224 lb (101.6 kg).  Vitals with BMI 06/04/2019 06/04/2019 02/02/2018  Height - '5\' 4"'$  '5\' 4"'$   Weight - 224 lbs 217 lbs 5 oz  BMI - 70.78 67.54  Systolic 492 010 071  Diastolic 84 91 76  Pulse - 100 88     Physical Exam  Constitutional: She appears well-developed and well-nourished.    HENT:  Head: Atraumatic.  Eyes: Conjunctivae are normal.  Neck: No JVD present. No thyromegaly present.  Cardiovascular: Normal rate, regular rhythm and intact distal pulses. Exam reveals no gallop.  No murmur heard. Pulsus difficult to feel due to patient's bodily habitus.  Pulmonary/Chest: Effort normal and breath sounds normal. No accessory muscle usage. No respiratory distress.  Abdominal: Soft. Bowel sounds are normal.  Obese and Pannus present  Musculoskeletal:        General: Normal range of motion.     Cervical back: Neck supple.  Neurological: She is alert.  Skin: Skin is warm and dry.  Psychiatric: She has a normal mood and affect.   Laboratory examination:   No results for input(s): NA, K, CL, CO2, GLUCOSE, BUN, CREATININE, CALCIUM, GFRNONAA, GFRAA in the last 8760 hours. CrCl cannot be calculated (Patient's most recent lab result is older than the maximum 21 days allowed.).  CMP Latest Ref Rng & Units 02/02/2018 06/05/2017 12/23/2016  Glucose 70 - 99 mg/dL 144(H) 154(H) 154(H)  BUN 8 - 23 mg/dL '14 14 12  '$ Creatinine 0.44 - 1.00 mg/dL 1.00 0.95 0.74  Sodium 135 - 145 mmol/L 140 140 138  Potassium 3.5 - 5.1 mmol/L 4.6 3.8 4.0  Chloride 98 - 111 mmol/L 106 107 108  CO2 22 - 32 mmol/L '26 23 23  '$ Calcium 8.9 - 10.3 mg/dL 9.7 9.7 9.2  Total Protein 6.5 - 8.1 g/dL 7.6 7.7 -  Total Bilirubin 0.3 - 1.2 mg/dL 1.3(H) 0.8 -  Alkaline Phos 38 - 126 U/L 120 103 -  AST 15 - 41 U/L 46(H) 25 -  ALT 0 - 44 U/L 80(H) 39 -   CBC Latest Ref Rng & Units 02/02/2018 06/05/2017 12/23/2016  WBC 4.0 - 10.5 K/uL 8.0 7.8 8.9  Hemoglobin 12.0 - 15.0 g/dL 12.1 12.0 11.8(L)  Hematocrit 36.0 - 46.0 % 37.6 37.0 35.8(L)  Platelets 150 - 400 K/uL 236 222 249   Lipid Panel  No results found for: CHOL, TRIG, HDL, CHOLHDL, VLDL, LDLCALC, LDLDIRECT HEMOGLOBIN A1C No results found for: HGBA1C, MPG TSH No results for input(s): TSH in the last 8760 hours.  External labs:   Basic Metabolic  PanelResulted: 03/28/7586 5:46 PM [facility] Component Name Value Ref Range  Sodium 140 135 - 146 MMOL/L  Potassium 3.6 3.5 - 5.3 MMOL/L  Chloride 105 98 - 110 MMOL/L  CO2 27 23 - 30 MMOL/L  BUN 18 8 - 24 MG/DL  Glucose 128 (H)    05/28/2019  Hb 12.8/HCT 38.0, WBC 7.3, platelets 219.  Total cholesterol 242, triglycerides 314, HDL 37, LDL 163.  Non-HDL cholesterol 205.   04/23/2013: CBC, CMP normal except for mildly elevated blood sugar at 113 mg. TSH was normal at 1.18.  06/10/2013: HbA1c 5.8%. TSH was normal.  Lipid Panel: Total cholesterol 276, triglycerides 265, HDL 39, LDL 184. LDL particle number markedly elevated 1920.  Medications and allergies  No Known Allergies   Current Outpatient Medications  Medication Instructions  . aspirin 81  mg, Oral, Every other day  . lisinopril-hydrochlorothiazide (ZESTORETIC) 20-12.5 MG tablet 1 tablet, Oral, BH-each morning  . metoprolol tartrate (LOPRESSOR) 25 mg, Oral, 2 times daily  . rosuvastatin (CRESTOR) 20 mg, Oral, Daily   Radiology:   No results found.  Cardiac Studies:   Nocturnal oximetry 05/31/2013: SPO2 less than 88% 0.2 minutes, less than 89% 0.3 minutes. Total desaturation events 197. Lowest SPO2 88%. Average power 21. Patient does not qualify for nocturnal oxygen therapy.  Holter monitor 06/13/2013: Predominant rhythm was normal sinus rhythm. Occasional PACs, symptoms correlated with PACs. Event report: baseline 05/13/10 NSR.  Echocardiogram 06/19/2013: 1. Left ventricle cavity is normal in size. Normal global wall motion. Normal systolic function. Calculated left ventricular ejection fraction is 56%. 2. Left atrial cavity is mildly dilated. 3. Normal mitral valve with mild regurgitation. 4. Normal tricuspid valve with trace regurgitation. No pulmonary hypertension  Nuclear stress test Exercise sestamibi stress test 06/10/2013: 1. Resting EKG showed normal sinus rhythm, poor R wave progression,  Stress EKG was negative for ischemia. Patient exercised on BRUCE PROTOCOL for 5 minutes 30 seconds. The maximum work level achieved was 7.3 MET's. There was 1.5 mm upsloping ST depression noted in the inferior and lateral leads at peak exercise, which resolved at < 2 minutes into recovery. The test was terminated due to achievement of the target heart rate. 2. Perfusion imaging study demonstrated mild soft tissue attenuation in the inferior wall. There was no e/o ischemia or scar. The left ventricular systolic function was normal at 53%. This is a low risk study.  EKG  EKG 06/04/2019: Sinus tachycardia, 101 bpm, left left axis deviation, left anterior fascicular block.  IVCD, LVH with repolarization abnormality.  Incomplete right bundle branch block.  Compared to 09/18/2017, LVH with repol abnormality and IVCD new.   Assessment     ICD-10-CM   1. Precordial pain  R07.2 CT CORONARY MORPH W/CTA COR W/SCORE W/CA W/CM &/OR WO/CM    CT CORONARY FRACTIONAL FLOW RESERVE DATA PREP    CT CORONARY FRACTIONAL FLOW RESERVE FLUID ANALYSIS    PCV ECHOCARDIOGRAM COMPLETE  2. Palpitations  R00.2 EKG 12-Lead  3. Dyspnea on exertion  R06.00   4. Mixed hyperlipidemia  E78.2 rosuvastatin (CRESTOR) 20 MG tablet  5. Primary hypertension  I10 PCV ECHOCARDIOGRAM COMPLETE    lisinopril-hydrochlorothiazide (ZESTORETIC) 20-12.5 MG tablet    Basic metabolic panel  6. Malaise and fatigue  R53.81    R53.83   7. Snoring  R06.83     Meds ordered this encounter  Medications  . rosuvastatin (CRESTOR) 20 MG tablet    Sig: Take 1 tablet (20 mg total) by mouth daily.    Dispense:  30 tablet    Refill:  2  . lisinopril-hydrochlorothiazide (ZESTORETIC) 20-12.5 MG tablet    Sig: Take 1 tablet by mouth every morning.    Dispense:  30 tablet    Refill:  2    There are no discontinued medications.  Recommendations:   Panzy Bubeck  is a 64 y.o. Caucasian female with history of hyperlipidemia, hyperglycemia, and breast  cancer s/p right breast lumpectomy in 2018 S/P RT and did not want maintenance oral chemotherapy who presents for evaluation of new onset of worsening palpitations for the past 3 weeks, worsening exertional dyspnea over the past several months and also episodes of chest tightness. Past medical history significant for hyperlipidemia, elevated blood pressure without diagnosis of hypertension, former history of tobacco use.I will follow up with her in 2-3 months or  sooner if she has concerns. I am concerned about long term prognosis in view of markedly elevated lipids, hyperglycemia and obesity.  I reviewed her external labs, physical examination is unremarkable except for obesity, markedly elevated blood pressure, patient's reluctance to take medications was discussed in detail.  She is now amenable for taking statins, also started her on Crestor 20 mg daily for mixed hyperlipidemia along with lisinopril HCT for hypertension.  I reviewed her previously performed labs and cardiac testing done in 2015, due to continued malaise and fatigue, loud snoring, she may eventually need a sleep study.  With regard to palpitations, suspect these are probably related to PACs and PVCs, if symptoms persist I may consider event monitor/telemetry, for now patient has been given metoprolol tartrate 25 mg p.o. twice daily prescription from her PCP and I advised her and encouraged her to start taking the medication.  I am concerned about her chest pain symptoms, cannot exclude significant coronary artery disease in view of prior radiation therapy, tobacco use, uncontrolled lipids and hypertension and perimenopausal age group, best option is to proceed with coronary CTA.  I spent 60 minutes with the patient discussing all these medical issues and decision making and evaluation of external records.  I will see her back after the test.  Adrian Prows, MD, [facility] 06/04/2019, 10:23 PM [facility] [place] Office: [phone]

## 2019-06-04 ENCOUNTER — Other Ambulatory Visit: Payer: Self-pay

## 2019-06-04 ENCOUNTER — Ambulatory Visit: Payer: BC Managed Care – PPO | Admitting: Cardiology

## 2019-06-04 ENCOUNTER — Encounter: Payer: Self-pay | Admitting: Cardiology

## 2019-06-04 VITALS — BP 173/84 | HR 100 | Temp 98.3°F | Ht 64.0 in | Wt 224.0 lb

## 2019-06-04 DIAGNOSIS — R0609 Other forms of dyspnea: Secondary | ICD-10-CM

## 2019-06-04 DIAGNOSIS — E782 Mixed hyperlipidemia: Secondary | ICD-10-CM

## 2019-06-04 DIAGNOSIS — R0683 Snoring: Secondary | ICD-10-CM

## 2019-06-04 DIAGNOSIS — R5381 Other malaise: Secondary | ICD-10-CM

## 2019-06-04 DIAGNOSIS — I10 Essential (primary) hypertension: Secondary | ICD-10-CM

## 2019-06-04 DIAGNOSIS — R002 Palpitations: Secondary | ICD-10-CM

## 2019-06-04 DIAGNOSIS — R072 Precordial pain: Secondary | ICD-10-CM

## 2019-06-04 MED ORDER — LISINOPRIL-HYDROCHLOROTHIAZIDE 20-12.5 MG PO TABS: 1.0000 | ORAL_TABLET | ORAL | 2 refills | Status: DC

## 2019-06-04 MED ORDER — ROSUVASTATIN CALCIUM 20 MG PO TABS: 20.0000 mg | ORAL_TABLET | Freq: Every day | ORAL | 2 refills | Status: DC

## 2019-06-06 NOTE — Telephone Encounter (Signed)
Please move up the echo and also f/u on coronary CTA. Are we backed up for echos?

## 2019-06-10 ENCOUNTER — Ambulatory Visit: Payer: BC Managed Care – PPO

## 2019-06-10 ENCOUNTER — Other Ambulatory Visit: Payer: Self-pay

## 2019-06-10 DIAGNOSIS — R072 Precordial pain: Secondary | ICD-10-CM

## 2019-06-10 DIAGNOSIS — I10 Essential (primary) hypertension: Secondary | ICD-10-CM

## 2019-06-18 ENCOUNTER — Telehealth (HOSPITAL_COMMUNITY): Payer: Self-pay | Admitting: Emergency Medicine

## 2019-06-18 NOTE — Telephone Encounter (Signed)
Attempted to call patient regarding upcoming cardiac CT appointment. °Left message on voicemail with name and callback number °Arshia Rondon RN Navigator Cardiac Imaging °[facility] °[phone] Office °[phone] Cell ° °

## 2019-06-19 ENCOUNTER — Other Ambulatory Visit: Payer: BC Managed Care – PPO

## 2019-06-19 ENCOUNTER — Ambulatory Visit (HOSPITAL_COMMUNITY)
Admission: RE | Admit: 2019-06-19 | Discharge: 2019-06-19 | Disposition: A | Discharge status: Home / Self Care | Payer: BC Managed Care – PPO | Source: Ambulatory Visit | Attending: Cardiology | Admitting: Cardiology

## 2019-06-19 ENCOUNTER — Telehealth (HOSPITAL_COMMUNITY): Payer: Self-pay | Admitting: Emergency Medicine

## 2019-06-19 ENCOUNTER — Other Ambulatory Visit: Payer: Self-pay

## 2019-06-19 ENCOUNTER — Encounter (HOSPITAL_COMMUNITY): Payer: Self-pay

## 2019-06-19 ENCOUNTER — Encounter: Payer: BC Managed Care – PPO | Admitting: *Deleted

## 2019-06-19 DIAGNOSIS — R072 Precordial pain: Secondary | ICD-10-CM

## 2019-06-19 DIAGNOSIS — Z006 Encounter for examination for normal comparison and control in clinical research program: Secondary | ICD-10-CM

## 2019-06-19 LAB — POCT I-STAT CREATININE: Creatinine, Ser: 0.7 mg/dL (ref 0.44–1.00)

## 2019-06-19 MED ORDER — METOPROLOL TARTRATE 5 MG/5ML IV SOLN
INTRAVENOUS | Status: AC
  Administered 2019-06-19: 17:00:00 5 mg via INTRAVENOUS
  Filled 2019-06-19: qty 15

## 2019-06-19 MED ORDER — NITROGLYCERIN 0.4 MG SL SUBL
SUBLINGUAL_TABLET | SUBLINGUAL | Status: AC
  Filled 2019-06-19: qty 2

## 2019-06-19 MED ORDER — NITROGLYCERIN 0.4 MG SL SUBL
0.8000 mg | SUBLINGUAL_TABLET | SUBLINGUAL | Status: DC | PRN
  Administered 2019-06-19: 17:00:00 0.8 mg via SUBLINGUAL

## 2019-06-19 MED ORDER — IOHEXOL 350 MG/ML SOLN
100.0000 mL | Freq: Once | INTRAVENOUS | Status: AC | PRN
  Administered 2019-06-19: 100 mL via INTRAVENOUS

## 2019-06-19 MED ORDER — METOPROLOL TARTRATE 5 MG/5ML IV SOLN
5.0000 mg | INTRAVENOUS | Status: DC | PRN
  Administered 2019-06-19 (×2): 5 mg via INTRAVENOUS

## 2019-06-19 NOTE — Telephone Encounter (Signed)
Reaching out to patient to offer assistance regarding upcoming cardiac imaging study; pt verbalizes understanding of appt date/time, parking situation and where to check in, pre-test NPO status and medications ordered, and verified current allergies; name and call back number provided for further questions should they arise Marchia Bond RN [facility] [phone] office [phone] cell   Pt calling to verify instructions she was given. States she did not take daily zestoric this morning, which was correct. She will take metoprolol 2 hr prior to scan. Pt states she is already at work and in an [employer] that will need removed for test but okay with that procedure.  States she can be a difficult IV start so encouraged plenty of water today until 1 hr prior to scan.  Pt verbalized understanding Clarise Cruz

## 2019-06-19 NOTE — Research (Unsigned)
Subject Name: Rhonda Cooley  Subject met inclusion and exclusion criteria.  The informed consent form, study requirements and expectations were reviewed with the subject and questions and concerns were addressed prior to the signing of the consent form.  The subject verbalized understanding of the trial requirements.  The subject agreed to participate in the CADFEM G4 trial and signed the informed consent at 1606 on 06/19/19  The informed consent was obtained prior to performance of any protocol-specific procedures for the subject.  A copy of the signed informed consent was given to the subject and a copy was placed in the subject's medical record.   [name]

## 2019-06-25 ENCOUNTER — Other Ambulatory Visit: Payer: Self-pay | Admitting: Family Medicine

## 2019-06-25 DIAGNOSIS — R002 Palpitations: Secondary | ICD-10-CM

## 2019-06-25 DIAGNOSIS — M542 Cervicalgia: Secondary | ICD-10-CM

## 2019-06-26 IMAGING — US ULTRASOUND RIGHT BREAST LIMITED
1 series · 13 of 25 positions shown · non-contrast
Comparison: Previous exam(s).

CLINICAL DATA: Screening recall for a right breast mass.

EXAM:
2D DIGITAL DIAGNOSTIC UNILATERAL RIGHT MAMMOGRAM WITH CAD AND
ADJUNCT TOMO
RIGHT BREAST ULTRASOUND

[Series 1: ultrasound right breast limited · 0.07mm/px · 13 of 28 slices shown]
[im 1/28]
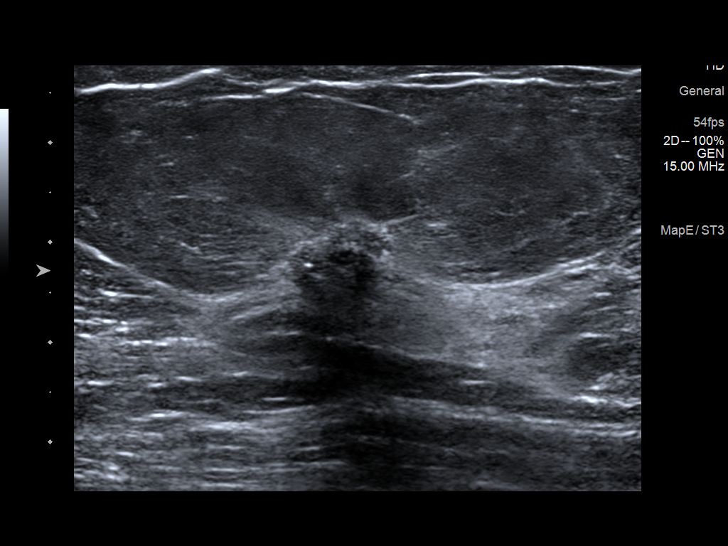
[im 3/28]
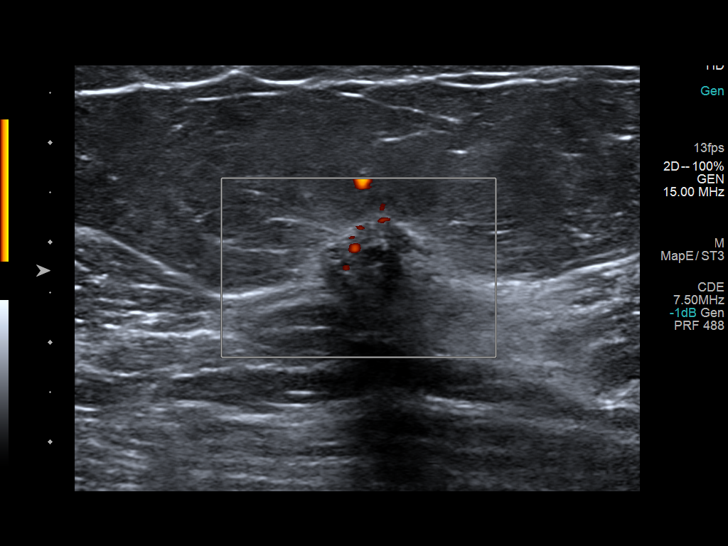
[im 5/28]
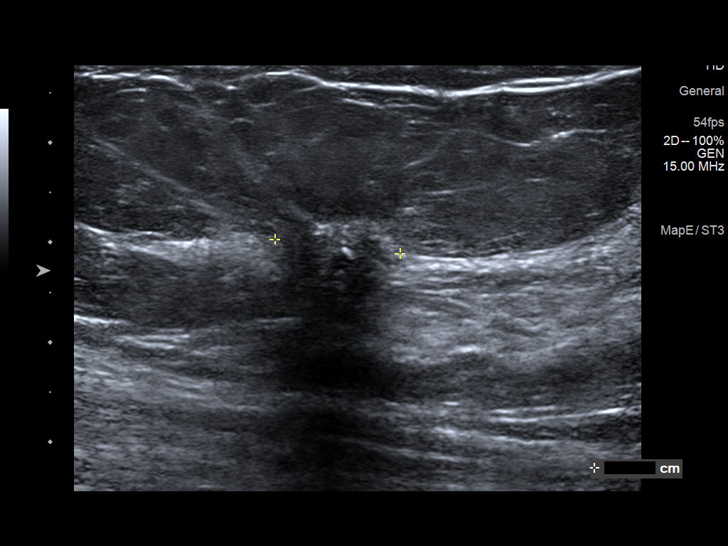
[im 7/28]
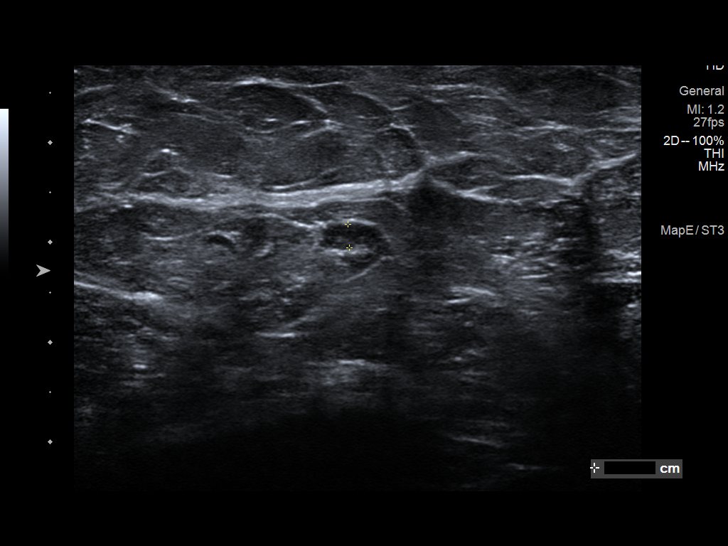
[im 10/28]
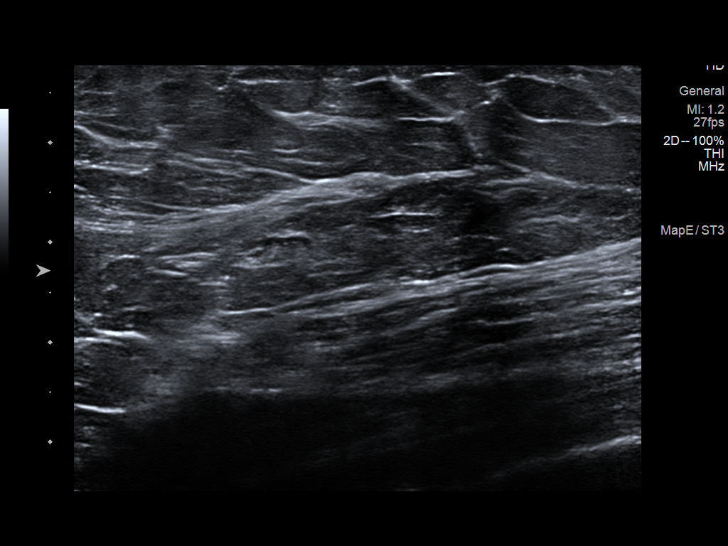
[im 12/28]
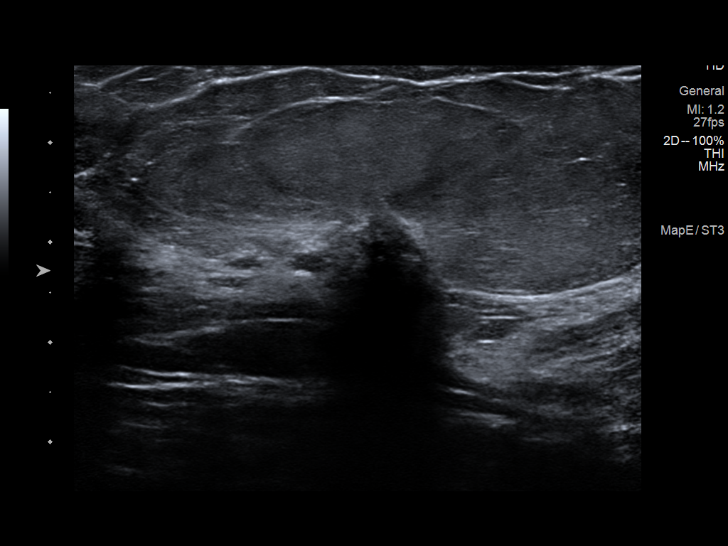
[im 14/28]
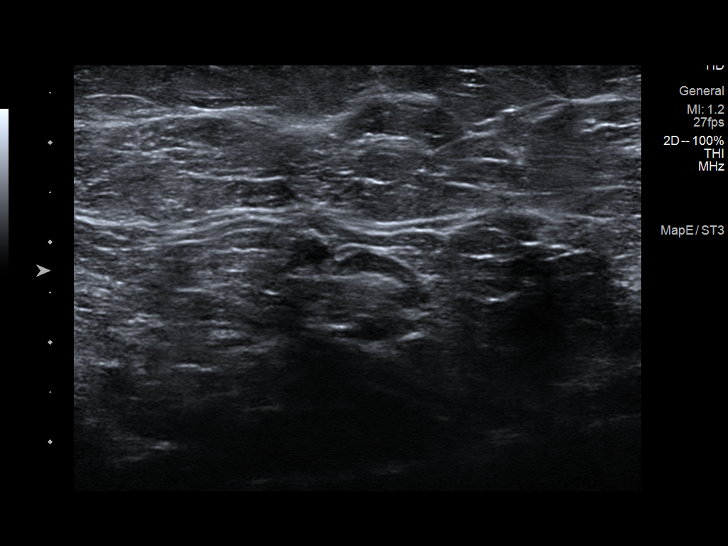
[im 16/28]
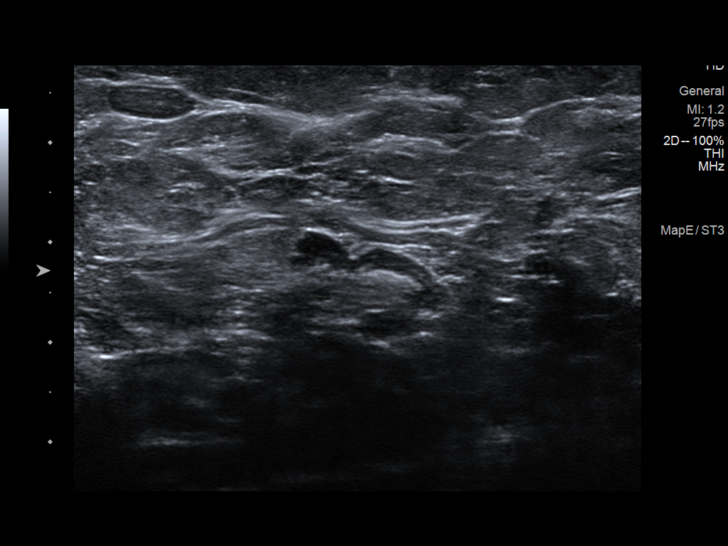
[im 19/28]
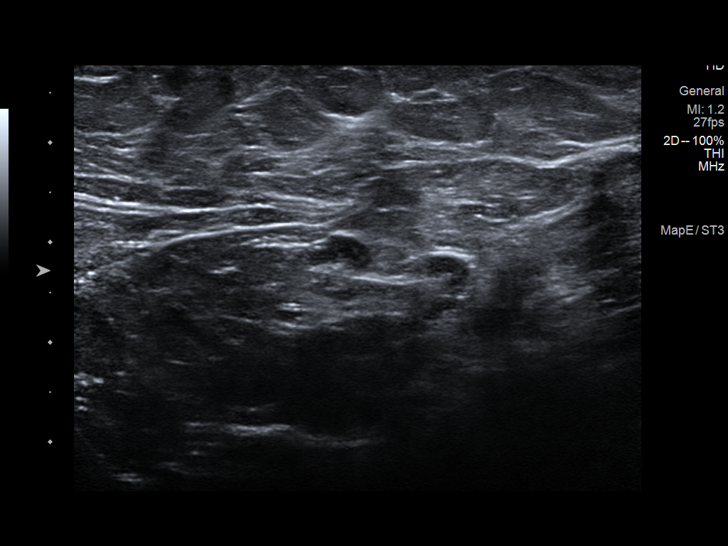
[im 21/28]
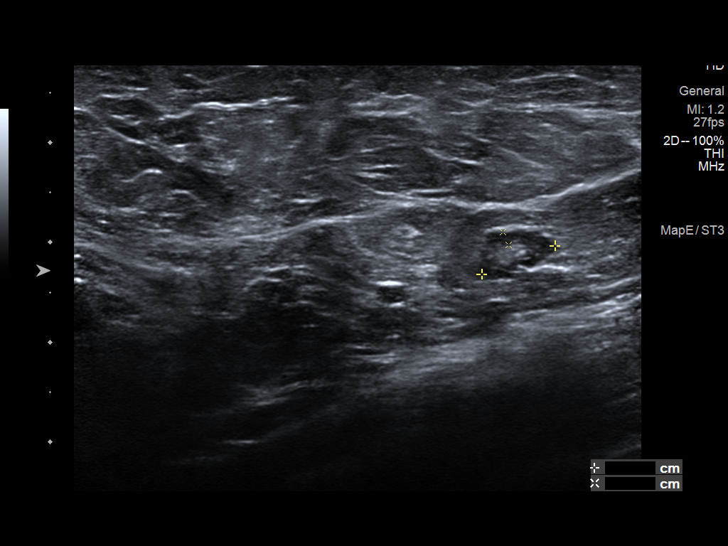
[im 23/28]
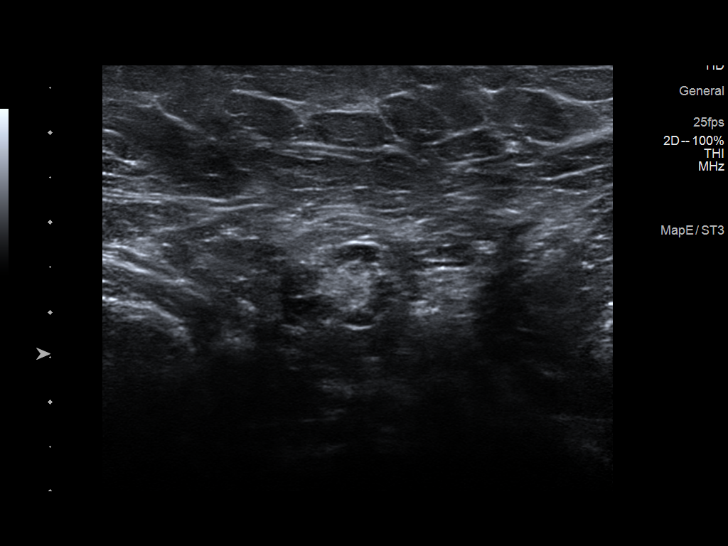
[im 25/28]
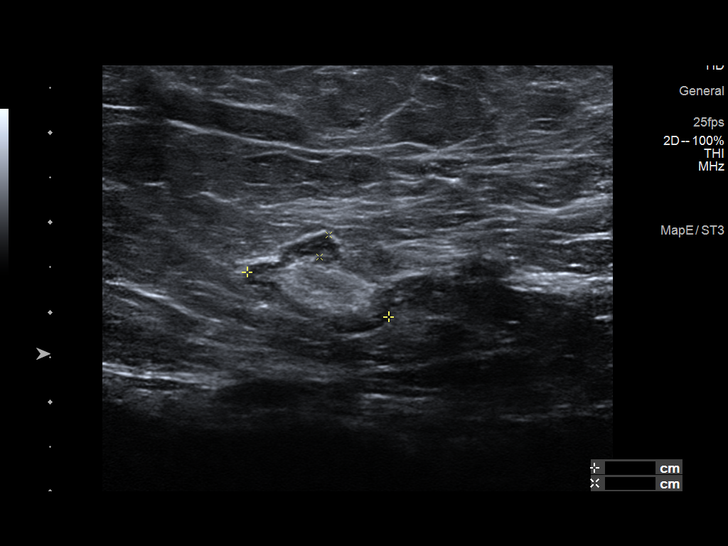
[im 28/28]
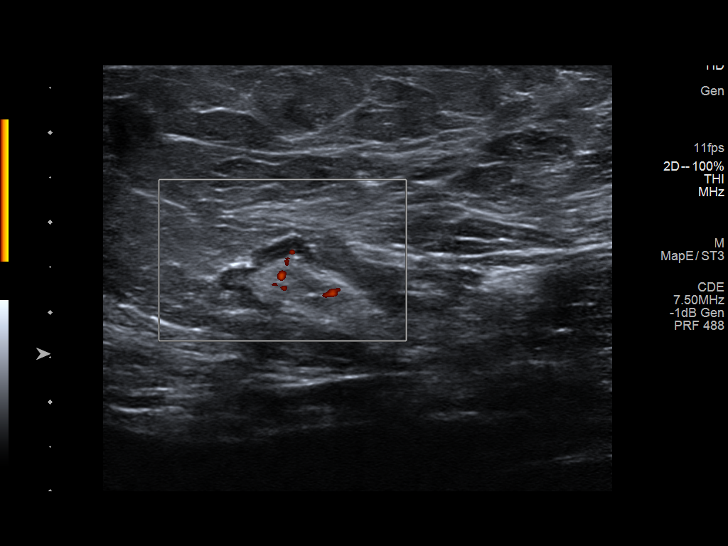

[13 of 25 positions shown; findings below may reference images not displayed]

ACR Breast Density Category c: The breast tissue is heterogeneously
dense, which may obscure small masses.
FINDINGS: In the upper-outer quadrant of the right breast there is an
irregular spiculated mass in the posterior depth. No definite
mammographic abnormalities are identified in the upper-outer
quadrant as seen on the screening mammogram, however ultrasound will
be performed for further evaluation.

Mammographic images were processed with CAD.

Ultrasound targeted to the right breast at [DATE], 4 cm from the
nipple demonstrates an irregular hypoechoic mass measuring 1.3 x
x 1.1 cm. No other suspicious sonographic abnormalities are
identified in the upper-outer quadrant. Multiple normal-appearing
lymph nodes are identified in the right axilla.
IMPRESSION: 1. There is an suspicious right breast mass at [DATE].

2.  No evidence of right axillary lymphadenopathy.

RECOMMENDATION:
Ultrasound-guided biopsy is recommended for the right breast mass.
This has been scheduled for 12/02/2016 at [DATE] p.m..

I have discussed the findings and recommendations with the patient.
Results were also provided in writing at the conclusion of the
visit. If applicable, a reminder letter will be sent to the patient
regarding the next appointment.

BI-RADS CATEGORY  5: Highly suggestive of malignancy.

## 2019-06-26 IMAGING — MG 2D DIGITAL DIAGNOSTIC UNILATERAL RIGHT MAMMOGRAM WITH CAD AND AD
8 of 12 series · 8 of 28 positions shown · non-contrast
Comparison: Previous exam(s).

CLINICAL DATA: Screening recall for a right breast mass.

EXAM:
2D DIGITAL DIAGNOSTIC UNILATERAL RIGHT MAMMOGRAM WITH CAD AND
ADJUNCT TOMO
RIGHT BREAST ULTRASOUND

[R CC synth-2D (1 of 2)]
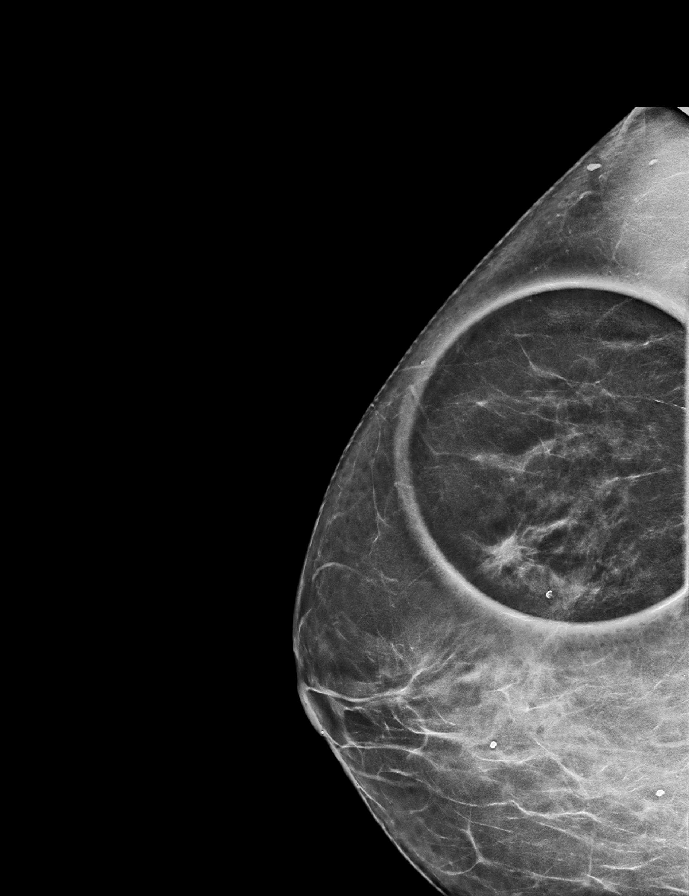

[R MLO (1 of 2)]
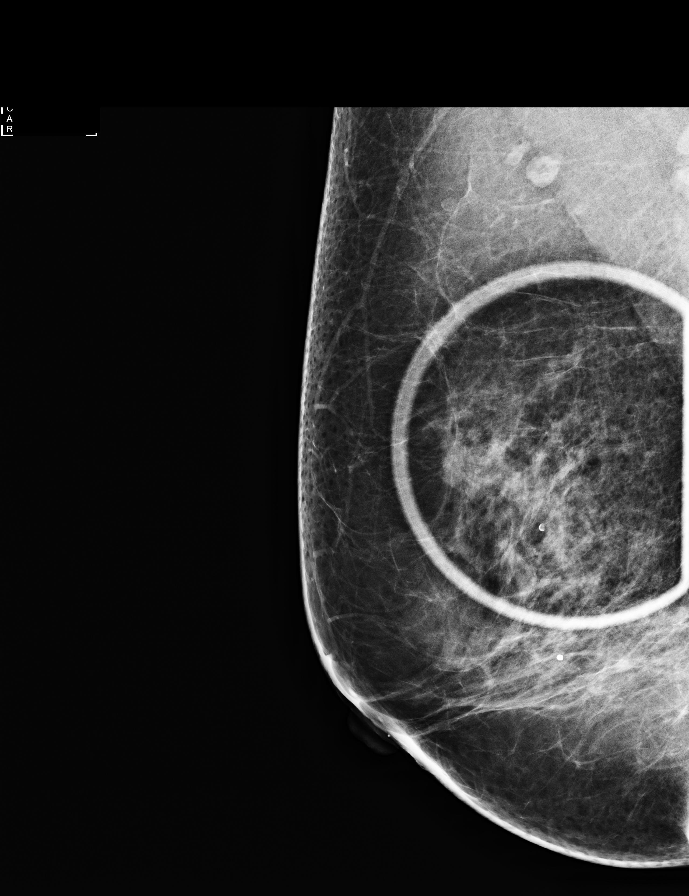

[R MLO synth-2D (1 of 2)]
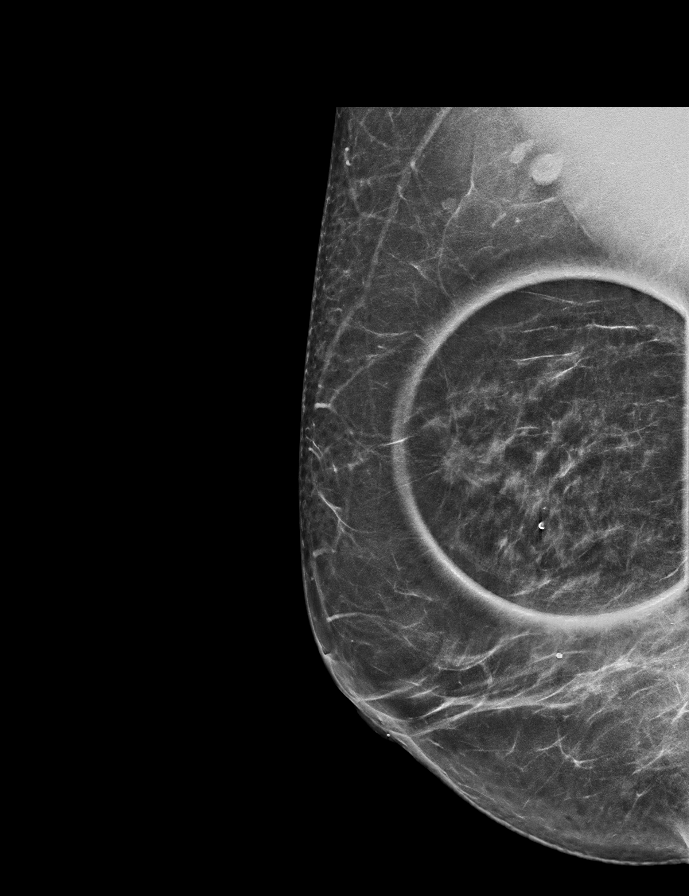

[R CC synth-2D (2 of 2)]
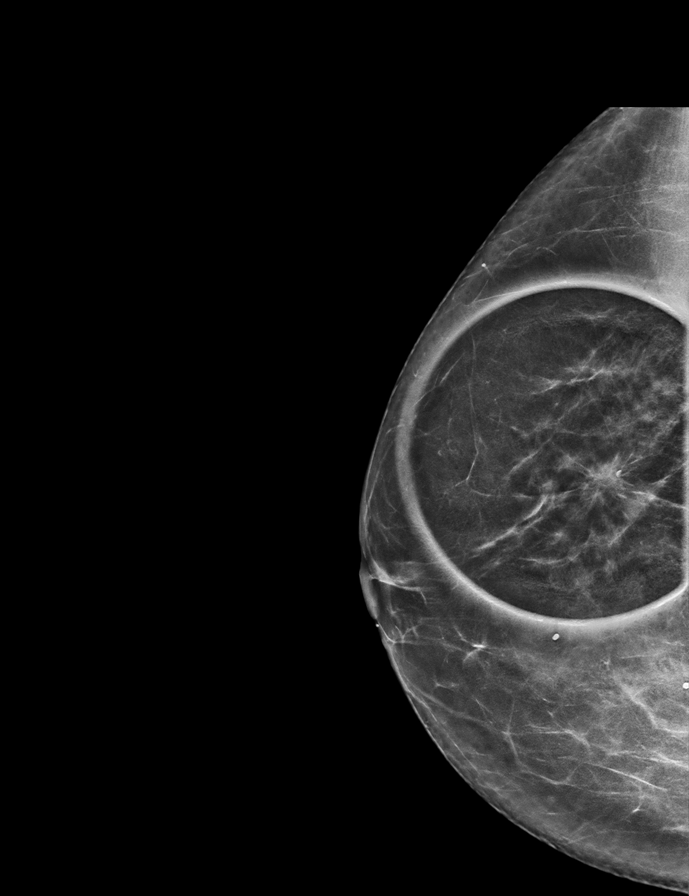

[R MLO (2 of 2)]
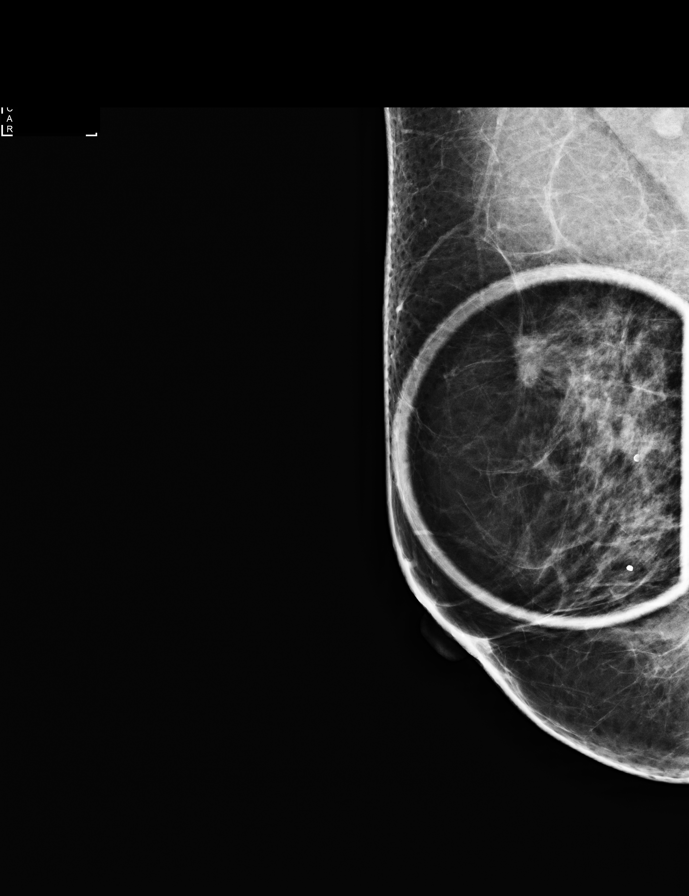

[R CC (1 of 2)]
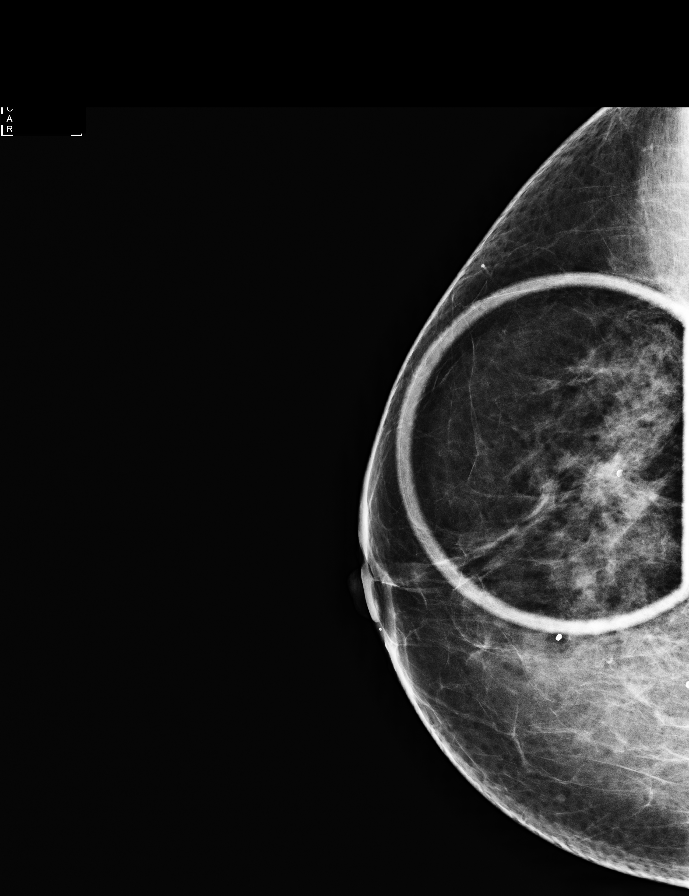

[R MLO synth-2D (2 of 2)]
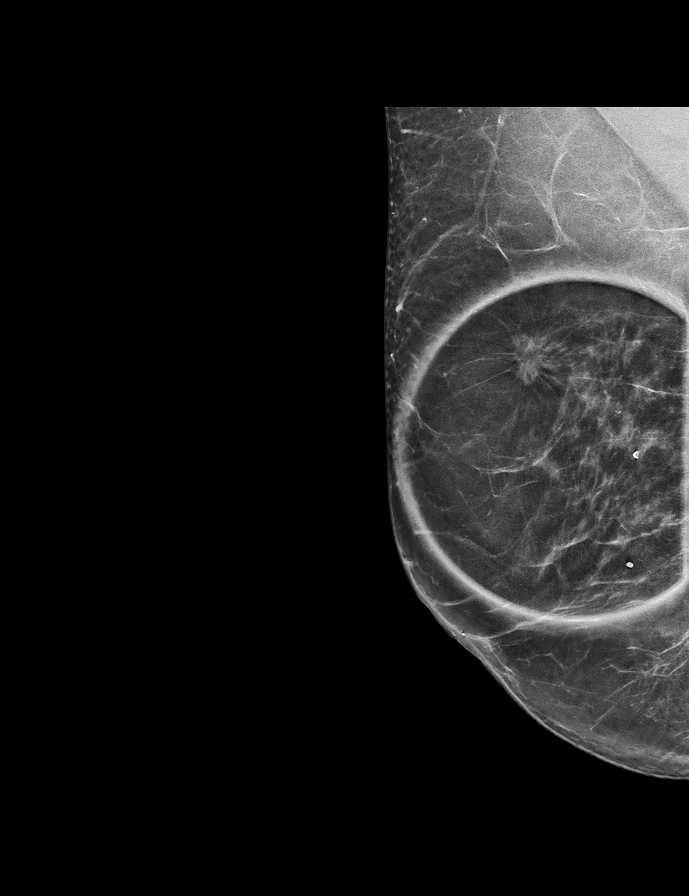

[R CC (2 of 2)]
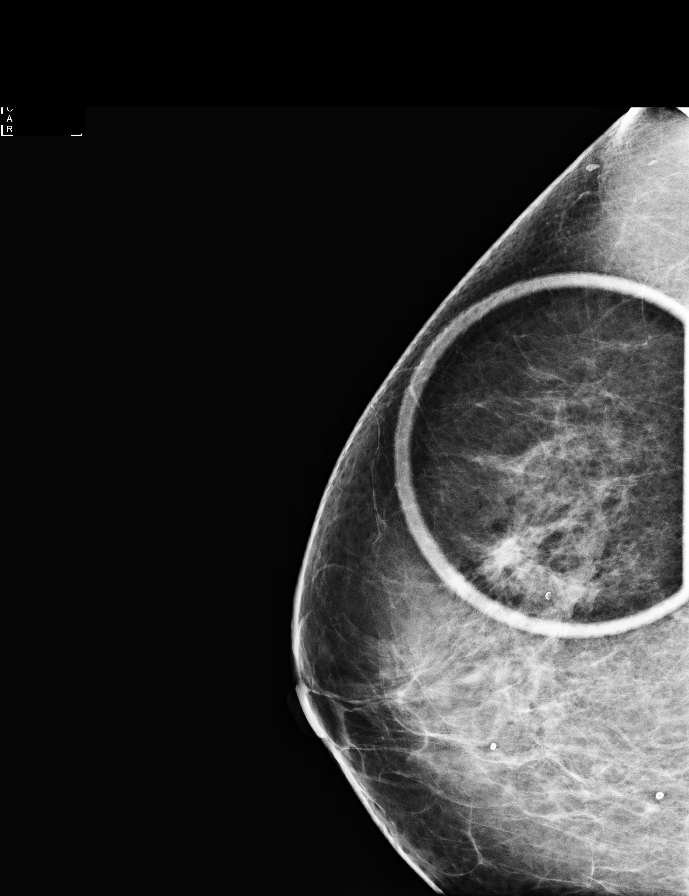

[8 of 28 positions shown; findings below may reference images not displayed]

ACR Breast Density Category c: The breast tissue is heterogeneously
dense, which may obscure small masses.
FINDINGS: In the upper-outer quadrant of the right breast there is an
irregular spiculated mass in the posterior depth. No definite
mammographic abnormalities are identified in the upper-outer
quadrant as seen on the screening mammogram, however ultrasound will
be performed for further evaluation.

Mammographic images were processed with CAD.

Ultrasound targeted to the right breast at [DATE], 4 cm from the
nipple demonstrates an irregular hypoechoic mass measuring 1.3 x
x 1.1 cm. No other suspicious sonographic abnormalities are
identified in the upper-outer quadrant. Multiple normal-appearing
lymph nodes are identified in the right axilla.
IMPRESSION: 1. There is an suspicious right breast mass at [DATE].

2.  No evidence of right axillary lymphadenopathy.

RECOMMENDATION:
Ultrasound-guided biopsy is recommended for the right breast mass.
This has been scheduled for 12/02/2016 at [DATE] p.m..

I have discussed the findings and recommendations with the patient.
Results were also provided in writing at the conclusion of the
visit. If applicable, a reminder letter will be sent to the patient
regarding the next appointment.

BI-RADS CATEGORY  5: Highly suggestive of malignancy.

## 2019-07-02 ENCOUNTER — Ambulatory Visit
Admission: RE | Admit: 2019-07-02 | Discharge: 2019-07-02 | Disposition: A | Discharge status: Home / Self Care | Payer: BC Managed Care – PPO | Source: Ambulatory Visit | Attending: Family Medicine | Admitting: Family Medicine

## 2019-07-02 DIAGNOSIS — M542 Cervicalgia: Secondary | ICD-10-CM

## 2019-07-02 DIAGNOSIS — R002 Palpitations: Secondary | ICD-10-CM

## 2019-07-24 ENCOUNTER — Encounter: Payer: Self-pay | Admitting: Cardiology

## 2019-07-24 ENCOUNTER — Ambulatory Visit: Payer: BC Managed Care – PPO | Admitting: Cardiology

## 2019-07-24 ENCOUNTER — Other Ambulatory Visit: Payer: Self-pay

## 2019-07-24 VITALS — BP 144/74 | HR 69 | Resp 16 | Ht 64.0 in | Wt 201.0 lb

## 2019-07-24 DIAGNOSIS — R0609 Other forms of dyspnea: Secondary | ICD-10-CM

## 2019-07-24 DIAGNOSIS — E782 Mixed hyperlipidemia: Secondary | ICD-10-CM

## 2019-07-24 DIAGNOSIS — I700 Atherosclerosis of aorta: Secondary | ICD-10-CM

## 2019-07-24 DIAGNOSIS — I10 Essential (primary) hypertension: Secondary | ICD-10-CM

## 2019-07-24 DIAGNOSIS — R002 Palpitations: Secondary | ICD-10-CM

## 2019-07-24 MED ORDER — METOPROLOL TARTRATE 50 MG PO TABS: 50.0000 mg | ORAL_TABLET | Freq: Two times a day (BID) | ORAL | 3 refills | Status: DC

## 2019-07-24 NOTE — Progress Notes (Signed)
Primary Physician/Referring:  [name]  Patient ID: Rhonda Cooley, female    DOB: 12-08-1955, 64 y.o.   MRN: [id number]  Chief Complaint  Patient presents with   Chest Pain   Hypertension   Hyperlipidemia   Follow-up   HPI:    Rhonda Cooley  is a 64 y.o. Caucasian female with history of hyperlipidemia, hyperglycemia, and breast cancer s/p right breast lumpectomy in 2018 S/P RT and did not want maintenance oral chemotherapy who had seen about 4 to 6 weeks ago for worsening dyspnea exertion, atypical chest pain and elevated blood pressure.  She has former history of tobacco use.  I had added metoprolol tartrate 25 mg p.o. twice daily for palpitations and also hypertension, since then states that she has not had any further chest pain, symptoms of palpitations improved except at night when she lays down.  Dyspnea has improved as she has continued to lose weight, over the past 6 months to year she has lost about 25 to 30 pounds in weight and she is still losing weight by diet modification.  Past Medical History:  Diagnosis Date   Anxiety    BPPV (benign paroxysmal positional vertigo)    Breast cancer ([place]) 12/02/2016   right breast   Chronic headaches    Complication of anesthesia    Hard to wake up   Endometriosis    "went in and cleaned"   Gall stones    Lumbar radiculopathy    Palpitations    Personal history of radiation therapy    PONV (postoperative nausea and vomiting)    Nausea without vomiting   Varicose veins    Past Surgical History:  Procedure Laterality Date   BACK SURGERY  2014   BREAST LUMPECTOMY     BREAST LUMPECTOMY WITH RADIOACTIVE SEED AND SENTINEL LYMPH NODE BIOPSY Right 12/26/2016   Procedure: BREAST LUMPECTOMY WITH RADIOACTIVE SEED AND RIGHT AXILLARY SENTINEL LYMPH NODE BIOPSY;  Surgeon: Alphonsa Overall, MD;  Location: [place];  Service: General;  Laterality: Right;   CERVICAL POLYPECTOMY     COLONOSCOPY  W/ POLYPECTOMY     TONSILLECTOMY AND ADENOIDECTOMY     as child   Family History  Problem Relation Age of Onset   Alzheimer's disease Mother    Hypertension Sister    Hypertension Brother     Social History   Tobacco Use   Smoking status: Former Smoker    Packs/day: 0.50    Years: 15.00    Pack years: 7.50    Types: Cigarettes    Quit date: 03/08/1988    Years since quitting: 31.3   Smokeless tobacco: Never Used  Substance Use Topics   Alcohol use: No    Alcohol/week: 0.0 standard drinks   Marital Status: Married  ROS  Review of Systems  Constitutional: Negative for chills, decreased appetite, malaise/fatigue and weight gain.       Morbidly Obese   Cardiovascular: Positive for palpitations. Negative for dyspnea on exertion, leg swelling and syncope.  Endocrine: Positive for heat intolerance (and hot flashes). Negative for cold intolerance.  Hematologic/Lymphatic: Does not bruise/bleed easily.  Musculoskeletal: Negative for joint swelling.  Gastrointestinal: Negative for abdominal pain, anorexia, change in bowel habit, hematochezia and melena.  Neurological: Negative for headaches and light-headedness.  Psychiatric/Behavioral: Negative for depression and substance abuse.   Objective  Blood pressure (!) 144/74, pulse 69, resp. rate 16, height _0  (1.626 m), weight 201 lb (91.2 kg), SpO2 99 %.  Vitals with BMI  07/24/2019 06/19/2019 06/19/2019  Height '5\' 4"'$  - -  Weight 201 lbs - -  BMI 33.29 - -  Systolic 518 841 660  Diastolic 74 80 80  Pulse 69 - -     Physical Exam Constitutional:      Appearance: She is well-developed.  HENT:     Head: Atraumatic.  Eyes:     Conjunctiva/sclera: Conjunctivae normal.  Neck:     Thyroid: No thyromegaly.     Vascular: No JVD.  Cardiovascular:     Rate and Rhythm: Normal rate and regular rhythm.     Pulses: Intact distal pulses.     Heart sounds: No murmur heard.  No gallop.      Comments: Pulsus difficult to feel due  to patient's bodily habitus. Pulmonary:     Effort: Pulmonary effort is normal. No accessory muscle usage or respiratory distress.     Breath sounds: Normal breath sounds.  Abdominal:     General: Bowel sounds are normal.     Palpations: Abdomen is soft.     Comments: Obese and Pannus present    Laboratory examination:   Recent Labs    06/19/19 1655  CREATININE 0.70   CrCl cannot be calculated (Patient's most recent lab result is older than the maximum 21 days allowed.).  CMP Latest Ref Rng & Units 06/19/2019 02/02/2018 06/05/2017  Glucose 70 - 99 mg/dL - 144(H) 154(H)  BUN 8 - 23 mg/dL - 14 14  Creatinine 0.44 - 1.00 mg/dL 0.70 1.00 0.95  Sodium 135 - 145 mmol/L - 140 140  Potassium 3.5 - 5.1 mmol/L - 4.6 3.8  Chloride 98 - 111 mmol/L - 106 107  CO2 22 - 32 mmol/L - 26 23  Calcium 8.9 - 10.3 mg/dL - 9.7 9.7  Total Protein 6.5 - 8.1 g/dL - 7.6 7.7  Total Bilirubin 0.3 - 1.2 mg/dL - 1.3(H) 0.8  Alkaline Phos 38 - 126 U/L - 120 103  AST 15 - 41 U/L - 46(H) 25  ALT 0 - 44 U/L - 80(H) 39   CBC Latest Ref Rng & Units 02/02/2018 06/05/2017 12/23/2016  WBC 4.0 - 10.5 K/uL 8.0 7.8 8.9  Hemoglobin 12.0 - 15.0 g/dL 12.1 12.0 11.8(L)  Hematocrit 36 - 46 % 37.6 37.0 35.8(L)  Platelets 150 - 400 K/uL 236 222 249   Lipid Panel  No results found for: CHOL, TRIG, HDL, CHOLHDL, VLDL, LDLCALC, LDLDIRECT HEMOGLOBIN A1C No results found for: HGBA1C, MPG TSH No results for input(s): TSH in the last 8760 hours.  External labs:   Basic Metabolic PanelResulted: 08/07/1599 5:46 PM [facility] Component Name Value Ref Range  Sodium 140 135 - 146 MMOL/L  Potassium 3.6 3.5 - 5.3 MMOL/L  Chloride 105 98 - 110 MMOL/L  CO2 27 23 - 30 MMOL/L  BUN 18 8 - 24 MG/DL  Glucose 128 (H)    05/28/2019  Hb 12.8/HCT 38.0, WBC 7.3, platelets 219.  Total cholesterol 242, triglycerides 314, HDL 37, LDL 163.  Non-HDL cholesterol 205.   04/23/2013: CBC, CMP normal except for mildly  elevated blood sugar at 113 mg. TSH was normal at 1.18.  06/10/2013: HbA1c 5.8%. TSH was normal.  Lipid Panel: Total cholesterol 276, triglycerides 265, HDL 39, LDL 184. LDL particle number markedly elevated 1920.  Medications and allergies  No Known Allergies   Current Outpatient Medications  Medication Instructions   Apple Cider Vinegar 188 MG CAPS 1 capsule, Oral, Daily   aspirin 81  mg, Oral, Every other day   Fish Oil 1,000 mg, Oral, Daily   lisinopril-hydrochlorothiazide (ZESTORETIC) 20-12.5 MG tablet 1 tablet, Oral, BH-each morning   metoprolol tartrate (LOPRESSOR) 50 mg, Oral, 2 times daily   Radiology:   No results found.  Cardiac Studies:   Nocturnal oximetry 05/31/2013: SPO2 less than 88% 0.2 minutes, less than 89% 0.3 minutes. Total desaturation events 197. Lowest SPO2 88%. Average power 21. Patient does not qualify for nocturnal oxygen therapy.  Holter monitor 06/13/2013: Predominant rhythm was normal sinus rhythm. Occasional PACs, symptoms correlated with PACs. Event report: baseline 05/13/10 NSR.  Echocardiogram 06/19/2013: 1. Left ventricle cavity is normal in size. Normal global wall motion. Normal systolic function. Calculated left ventricular ejection fraction is 56%. 2. Left atrial cavity is mildly dilated. 3. Normal mitral valve with mild regurgitation. 4. Normal tricuspid valve with trace regurgitation. No pulmonary hypertension  Nuclear stress test Exercise sestamibi stress test 06/10/2013: 1. Resting EKG showed normal sinus rhythm, poor R wave progression, Stress EKG was negative for ischemia. Patient exercised on BRUCE PROTOCOL for 5 minutes 30 seconds. The maximum work level achieved was 7.3 MET's. There was 1.5 mm upsloping ST depression noted in the inferior and lateral leads at peak exercise, which resolved at < 2 minutes into recovery. The test was terminated due to achievement of the target heart rate. 2. Perfusion imaging study demonstrated mild  soft tissue attenuation in the inferior wall. There was no e/o ischemia or scar. The left ventricular systolic function was normal at 53%. This is a low risk study.  Coronary CTA 06/19/2019: 1. 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 2. Coronary calcium score of 48. This was 79th percentile for age and sex matched control. 3. Normal coronary origin with right dominance.  4. Aortic annular calcification and aortic atherosclerosis.  Echocardiogram 06/10/2019:  Normal LV systolic function with visual EF 55-60%. Left ventricle cavity  is normal in size. Mild left ventricular hypertrophy. Normal global wall  motion. Doppler evidence of grade II (pseudonormal) diastolic dysfunction,  elevated LAP. Calculated EF 55%.  Left atrial cavity is mildly dilated.  Normal mitral valve leaflet mobility. Mild mitral valve leaflet  thickening. Moderate (Grade II) mitral regurgitation.  IVC is dilated with respiratory variation.  No prior study for comparison.  Carotid artery duplex 07/02/2019: Color duplex indicates minimal heterogeneous and calcified plaque, with no hemodynamically significant stenosis by duplex criteria in the extracranial cerebrovascular circulation.  EKG  EKG 06/04/2019: Sinus tachycardia, 101 bpm, left left axis deviation, left anterior fascicular block.  IVCD, LVH with repolarization abnormality.  Incomplete right bundle branch block.  Compared to 09/18/2017, LVH with repol abnormality and IVCD new.   Assessment     ICD-10-CM   1. Dyspnea on exertion  R06.00   2. Palpitations  R00.2 metoprolol tartrate (LOPRESSOR) 50 MG tablet  3. Mixed hyperlipidemia  E78.2   4. Primary hypertension  I10 metoprolol tartrate (LOPRESSOR) 50 MG tablet  5. Aortic atherosclerosis (HCC)  I70.0     Meds ordered this encounter  Medications   metoprolol tartrate (LOPRESSOR) 50 MG tablet    Sig: Take 1 tablet (50 mg total) by mouth in the morning and at bedtime.    Dispense:  180  tablet    Refill:  3    Medications Discontinued During This Encounter  Medication Reason   rosuvastatin (CRESTOR) 20 MG tablet Patient Preference   metoprolol tartrate (LOPRESSOR) 25 MG tablet Reorder    Recommendations:   Katharine Look  Lusty  is a 64 y.o. Caucasian female with history of hyperlipidemia, hyperglycemia, and breast cancer s/p right breast lumpectomy in 2018 S/P RT and did not want maintenance oral chemotherapy, prior history of tobacco use who had seen about 4 to 6 weeks ago, presents here for follow-up of the patient's, atypical chest pain.  She is tolerating metoprolol and has noticed improvement in blood pressure and also palpitations.  I reviewed her results of the coronary CTA, which shows mild coronary calcification and also aortic atherosclerosis.  I also reviewed the echocardiogram which reveals normal LVEF but moderate MR but clinically does not appear to be significant.  I do not hear any significant murmur on exam.  Unless she has a loud murmur during follow-up, worsening dyspnea, would not recommend repeating echocardiogram.  With regard to palpitations, symptoms appear to suggest PACs and PVCs.  As the blood pressure is still elevated, will increase metoprolol from 25 mg twice daily to 50 mg p.o. twice daily.   With regard to mixed hyperlipidemia, related to diet, I had prescribed her statins which she did not want to start.  She is taking vinegar and fish oil capsules a supplement, but she is willing and open to starting statins after 6 to 3 months of trying weight loss and supplements if lipids are still high she would like to start statin therapy.  Otherwise stable from cardiac standpoint, unless symptoms are worsening, I will see her back on a as needed basis. With regard to palpitations, suspect these are probably related to PACs and PVCs, if symptoms persist I may consider event monitor/telemetry, for now patient has been given metoprolol tartrate 25 mg p.o. twice  daily prescription from her PCP and I advised her and encouraged her to start taking the medication.  I am concerned about her chest pain symptoms, cannot exclude significant coronary artery disease in view of prior radiation therapy, tobacco use, uncontrolled lipids and hypertension and perimenopausal age group, best option is to proceed with coronary CTA.  I spent 60 minutes with the patient discussing all these medical issues and decision making and evaluation of external records.  I will see her back after the test.  Adrian Prows, MD, [facility] 07/24/2019, 10:46 AM [facility] [place] Office: [phone]

## 2020-01-27 ENCOUNTER — Other Ambulatory Visit: Payer: Self-pay | Admitting: Cardiology

## 2020-01-27 DIAGNOSIS — I10 Essential (primary) hypertension: Secondary | ICD-10-CM

## 2020-04-29 ENCOUNTER — Encounter: Payer: Self-pay | Admitting: Cardiology

## 2020-04-29 ENCOUNTER — Other Ambulatory Visit: Payer: Self-pay

## 2020-04-29 ENCOUNTER — Ambulatory Visit: Payer: BC Managed Care – PPO | Admitting: Cardiology

## 2020-04-29 VITALS — BP 149/72 | HR 98 | Temp 98.3°F | Resp 16 | Ht 64.0 in | Wt 218.2 lb

## 2020-04-29 DIAGNOSIS — I493 Ventricular premature depolarization: Secondary | ICD-10-CM

## 2020-04-29 DIAGNOSIS — R0609 Other forms of dyspnea: Secondary | ICD-10-CM

## 2020-04-29 DIAGNOSIS — I340 Nonrheumatic mitral (valve) insufficiency: Secondary | ICD-10-CM

## 2020-04-29 DIAGNOSIS — I10 Essential (primary) hypertension: Secondary | ICD-10-CM

## 2020-04-29 DIAGNOSIS — R0600 Dyspnea, unspecified: Secondary | ICD-10-CM

## 2020-04-29 DIAGNOSIS — Z6837 Body mass index (BMI) 37.0-37.9, adult: Secondary | ICD-10-CM

## 2020-04-29 DIAGNOSIS — R0683 Snoring: Secondary | ICD-10-CM

## 2020-04-29 DIAGNOSIS — E782 Mixed hyperlipidemia: Secondary | ICD-10-CM

## 2020-04-29 MED ORDER — OLMESARTAN MEDOXOMIL-HCTZ 40-12.5 MG PO TABS: 1.0000 | ORAL_TABLET | Freq: Every day | ORAL | 3 refills | Status: DC

## 2020-04-29 NOTE — Progress Notes (Signed)
Primary Physician/Referring:  [name]  Patient ID: Rhonda Cooley, female    DOB: [birth year], 65 y.o.   MRN: [id number]  Chief Complaint  Patient presents with  . Hypertension  . Fatigue  . Shortness of Breath  . Follow-up   HPI:    Rhonda Cooley  is a 65 y.o. Caucasian female with history of hypertension, hyperlipidemia, hyperglycemia, and breast cancer s/p right breast lumpectomy in 2018 S/P RT and did not want maintenance oral chemotherapy, prior history of tobacco use coronary CTA revealing no significant disease in May 2021 however calcium score was 48 and placed around 79th percentile.  She also has moderate mitral regurgitation.   She made an appointment to see [place] as she has noticed marked dyspnea on exertion worse over the past couple months, frequent episodes of palpitations, uncontrolled hypertension.  Patient states that although she has gained weight about 20 to 25 pounds, she never was this dyspneic with activities that she is doing presently.  No associated chest pain.  No PND or orthopnea.  No leg edema.  She has also noticed marked increase in palpitations.  Especially if they occur while at rest but sometimes with exertional activity and feels like a heavy thump.  She has not had any rapid heartbeat.  She also complains of being markedly tired and fatigued with decreased exercise tolerance.  Past Medical History:  Diagnosis Date  . Anxiety   . BPPV (benign paroxysmal positional vertigo)   . Breast cancer ([place]) 12/02/2016   right breast  . Chronic headaches   . Complication of anesthesia    Hard to wake up  . Endometriosis    "went in and cleaned"  . Gall stones   . Lumbar radiculopathy   . Palpitations   . Personal history of radiation therapy   . PONV (postoperative nausea and vomiting)    Nausea without vomiting  . Varicose veins    Past Surgical History:  Procedure Laterality Date  . BACK SURGERY  2014  . BREAST  LUMPECTOMY    . BREAST LUMPECTOMY WITH RADIOACTIVE SEED AND SENTINEL LYMPH NODE BIOPSY Right 12/26/2016   Procedure: BREAST LUMPECTOMY WITH RADIOACTIVE SEED AND RIGHT AXILLARY SENTINEL LYMPH NODE BIOPSY;  Surgeon: Alphonsa Overall, MD;  Location: [place];  Service: General;  Laterality: Right;  . CERVICAL POLYPECTOMY    . COLONOSCOPY W/ POLYPECTOMY    . TONSILLECTOMY AND ADENOIDECTOMY     as child   Family History  Problem Relation Age of Onset  . Alzheimer's disease Mother   . Hypertension Sister   . Hypertension Brother     Social History   Tobacco Use  . Smoking status: Former Smoker    Packs/day: 0.50    Years: 15.00    Pack years: 7.50    Types: Cigarettes    Quit date: 03/08/1988    Years since quitting: 32.1  . Smokeless tobacco: Never Used  Substance Use Topics  . Alcohol use: No    Alcohol/week: 0.0 standard drinks   Marital Status: Married  ROS  Review of Systems  Cardiovascular: Positive for dyspnea on exertion and palpitations. Negative for chest pain and leg swelling.  Respiratory: Positive for snoring.   Gastrointestinal: Negative for melena.   Objective  Blood pressure (!) 149/72, pulse 98, temperature 98.3 F (36.8 C), temperature source Temporal, resp. rate 16, height _0  (1.626 m), weight 218 lb 3.2 oz (99 kg), SpO2 98 %.  Vitals with BMI 04/29/2020 07/24/2019 06/19/2019  Height _0  _1  -  Weight 218 lbs 3 oz 201 lbs -  BMI 62.69 48.54 -  Systolic 627 035 009  Diastolic 72 74 80  Pulse 98 69 -     Physical Exam Constitutional:      Appearance: She is well-developed.  HENT:     Head: Atraumatic.  Eyes:     Conjunctiva/sclera: Conjunctivae normal.  Neck:     Thyroid: No thyromegaly.     Vascular: No JVD.  Cardiovascular:     Rate and Rhythm: Normal rate and regular rhythm.     Pulses: Intact distal pulses.          Carotid pulses are on the right side with bruit and on the left side with bruit.    Heart sounds: Murmur heard.    Crescendo-decrescendo mid to late systolic murmur is present at the apex. No gallop.      Comments: Pulsus difficult to feel due to patient's bodily habitus. Pulmonary:     Effort: Pulmonary effort is normal. No accessory muscle usage or respiratory distress.     Breath sounds: Normal breath sounds.  Abdominal:     General: Bowel sounds are normal.     Palpations: Abdomen is soft.     Comments: Obese and Pannus present    Laboratory examination:   Recent Labs    06/19/19 1655  CREATININE 0.70   CrCl cannot be calculated (Patient's most recent lab result is older than the maximum 21 days allowed.).  CMP Latest Ref Rng & Units 06/19/2019 02/02/2018 06/05/2017  Glucose 70 - 99 mg/dL - 144(H) 154(H)  BUN 8 - 23 mg/dL - 14 14  Creatinine 0.44 - 1.00 mg/dL 0.70 1.00 0.95  Sodium 135 - 145 mmol/L - 140 140  Potassium 3.5 - 5.1 mmol/L - 4.6 3.8  Chloride 98 - 111 mmol/L - 106 107  CO2 22 - 32 mmol/L - 26 23  Calcium 8.9 - 10.3 mg/dL - 9.7 9.7  Total Protein 6.5 - 8.1 g/dL - 7.6 7.7  Total Bilirubin 0.3 - 1.2 mg/dL - 1.3(H) 0.8  Alkaline Phos 38 - 126 U/L - 120 103  AST 15 - 41 U/L - 46(H) 25  ALT 0 - 44 U/L - 80(H) 39   CBC Latest Ref Rng & Units 02/02/2018 06/05/2017 12/23/2016  WBC 4.0 - 10.5 K/uL 8.0 7.8 8.9  Hemoglobin 12.0 - 15.0 g/dL 12.1 12.0 11.8(L)  Hematocrit 36.0 - 46.0 % 37.6 37.0 35.8(L)  Platelets 150 - 400 K/uL 236 222 249   Lipid Panel  No results found for: CHOL, TRIG, HDL, CHOLHDL, VLDL, LDLCALC, LDLDIRECT HEMOGLOBIN A1C No results found for: HGBA1C, MPG TSH No results for input(s): TSH in the last 8760 hours.  External labs:   Basic Metabolic PanelResulted: 3/81/8299 5:46 PM [facility] Component Name Value Ref Range  Sodium 140 135 - 146 MMOL/L  Potassium 3.6 3.5 - 5.3 MMOL/L  Chloride 105 98 - 110 MMOL/L  CO2 27 23 - 30 MMOL/L  BUN 18 8 - 24 MG/DL  Glucose 128 (H)    05/28/2019  Hb 12.8/HCT 38.0, WBC 7.3, platelets  219.  Total cholesterol 242, triglycerides 314, HDL 37, LDL 163.  Non-HDL cholesterol 205.   04/23/2013: CBC, CMP normal except for mildly elevated blood sugar at 113 mg. TSH was normal at 1.18.  06/10/2013: HbA1c 5.8%. TSH was normal.  Lipid Panel: Total cholesterol 276, triglycerides 265, HDL 39, LDL 184. LDL particle number  markedly elevated 1920.  Current Outpatient Medications on File Prior to Visit  Medication Sig Dispense Refill  . aspirin 81 MG chewable tablet Chew 81 mg by mouth every other day.    . metoprolol tartrate (LOPRESSOR) 50 MG tablet Take 1 tablet (50 mg total) by mouth in the morning and at bedtime. (Patient taking differently: Take 50 mg by mouth in the morning and at bedtime. 37m two to three times daily) 180 tablet 3  . Omega-3 Fatty Acids (FISH OIL) 1000 MG CAPS Take 1,000 mg by mouth daily.     No current facility-administered medications on file prior to visit.    Medications and allergies  No Known Allergies   Radiology:   No results found.  Cardiac Studies:   Nocturnal oximetry 05/31/2013: SPO2 less than 88% 0.2 minutes, less than 89% 0.3 minutes. Total desaturation events 197. Lowest SPO2 88%. Average power 21. Patient does not qualify for nocturnal oxygen therapy.  Holter monitor 06/13/2013: Predominant rhythm was normal sinus rhythm. Occasional PACs, symptoms correlated with PACs. Event report: baseline 05/13/10 NSR.  Nuclear stress test Exercise sestamibi stress test 06/10/2013: 1. Resting EKG showed normal sinus rhythm, poor R wave progression, Stress EKG was negative for ischemia. Patient exercised on BRUCE PROTOCOL for 5 minutes 30 seconds. The maximum work level achieved was 7.3 MET's. There was 1.5 mm upsloping ST depression noted in the inferior and lateral leads at peak exercise, which resolved at < 2 minutes into recovery. The test was terminated due to achievement of the target heart rate. 2. Perfusion imaging study demonstrated mild soft  tissue attenuation in the inferior wall. There was no e/o ischemia or scar. The left ventricular systolic function was normal at 53%. This is a low risk study.  Coronary CTA 06/19/2019: 1. 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 2. Coronary calcium score of 48. This was 79th percentile for age and sex matched control. 3. Normal coronary origin with right dominance. 4. Aortic annular calcification and aortic atherosclerosis.  Echocardiogram 06/10/2019:  Normal LV systolic function with visual EF 55-60%. Left ventricle cavity  is normal in size. Mild left ventricular hypertrophy. Normal global wall  motion. Doppler evidence of grade II (pseudonormal) diastolic dysfunction,  elevated LAP. Calculated EF 55%.  Left atrial cavity is mildly dilated.  Normal mitral valve leaflet mobility. Mild mitral valve leaflet  thickening. Moderate (Grade II) mitral regurgitation.  IVC is dilated with respiratory variation.  No prior study for comparison.  Carotid artery duplex 07/02/2019: Color duplex indicates minimal heterogeneous and calcified plaque, with no hemodynamically significant stenosis by duplex criteria in the extracranial cerebrovascular circulation.  EKG  EKG 04/29/2020: Normal sinus rhythm at the rate of 93 bpm, left atrial enlargement, left axis deviation, left anterior fascicular block.  Right bundle branch block.  EKG 06/04/2019: Sinus tachycardia, 101 bpm, left left axis deviation, left anterior fascicular block.  IVCD, LVH with repolarization abnormality.  Incomplete right bundle branch block.  Compared to 09/18/2017, LVH with repol abnormality and IVCD new.   Assessment     ICD-10-CM   1. Dyspnea on exertion  R06.00 PCV ECHOCARDIOGRAM COMPLETE    Ambulatory referral to Sleep Studies  2. Moderate mitral regurgitation  I34.0   3. Primary hypertension  I10 EKG 12-Lead    olmesartan-hydrochlorothiazide (BENICAR HCT) 40-12.5 MG tablet  4. Mixed hyperlipidemia  E78.2    5. Class 2 severe obesity due to excess calories with serious comorbidity and body mass index (BMI) of 37.0 to  37.9 in adult [facility])  E66.01 Ambulatory referral to Sleep Studies   Z68.37   6. PVC (premature ventricular contraction)  I49.3 LONG TERM MONITOR (3-14 DAYS)    Ambulatory referral to Sleep Studies  7. Loud snoring  R06.83 Ambulatory referral to Sleep Studies   Meds ordered this encounter  Medications  . olmesartan-hydrochlorothiazide (BENICAR HCT) 40-12.5 MG tablet    Sig: Take 1 tablet by mouth daily.    Dispense:  30 tablet    Refill:  3   Medications Discontinued During This Encounter  Medication Reason  . lisinopril-hydrochlorothiazide (ZESTORETIC) 20-12.5 MG tablet Change in therapy    Recommendations:   Rhonda Cooley  is a 64 y.o. Caucasian female with history of hypertension, hyperlipidemia, hyperglycemia, and breast cancer s/p right breast lumpectomy in 2018 S/P RT and did not want maintenance oral chemotherapy, prior history of tobacco use coronary CTA revealing no significant disease in May 2021 however calcium score was 48 and placed around 79th percentile.  She also has moderate mitral regurgitation.   She made an appointment to see [place] as she has noticed marked dyspnea on exertion worse over the past couple months, frequent episodes of palpitations, uncontrolled hypertension.  In view of her obesity and symptoms suggestive of sleep apnea, cannot exclude atrial fibrillation as well.  We will perform Zio patch for 2 weeks.  Refer her for sleep study, referral sent to Dr. Rexene Alberts..  With regard to her marked dyspnea on exertion, it may be related to weight gain but patient states that even when she weighed this heavy the dyspnea was not as bad.  I will repeat an echocardiogram to reevaluate her mitral regurgitation.  Coronary CTA in May 2021 had revealed no significant coronary disease.  My suspicion is that her hypertension, obesity may have led to a worsening dyspnea and  also palpitations as well.  We will discontinue lisinopril HCT and switch her to olmesartan HCT 40/12.5 mg daily.  Continue metoprolol for now.  I reviewed the hyperlipidemia status with the patient, I am extremely concerned about carotid atherosclerosis noted by recent carotid duplex although no high-grade stenosis, coronary calcification in the upper percentile for age and sex all make her extremely high risk.  He appears to be reluctant to starting statin therapy for now.  I will try to reiterate this again on her next office visit.  Would like to see her back in 6 weeks.  If her symptoms persist I may consider right and left heart catheterization.  This was a 40-minute office visit encounter.   Adrian Prows, MD, [facility] 04/29/2020, 5:28 PM Office: [phone] Pager: [phone]

## 2020-05-12 ENCOUNTER — Inpatient Hospital Stay: Payer: BC Managed Care – PPO

## 2020-05-12 ENCOUNTER — Ambulatory Visit: Payer: BC Managed Care – PPO

## 2020-05-12 ENCOUNTER — Other Ambulatory Visit: Payer: Self-pay

## 2020-05-12 DIAGNOSIS — I493 Ventricular premature depolarization: Secondary | ICD-10-CM

## 2020-05-12 DIAGNOSIS — R0600 Dyspnea, unspecified: Secondary | ICD-10-CM

## 2020-05-12 DIAGNOSIS — R0609 Other forms of dyspnea: Secondary | ICD-10-CM

## 2020-06-03 ENCOUNTER — Other Ambulatory Visit: Payer: Self-pay

## 2020-06-03 ENCOUNTER — Encounter: Payer: Self-pay | Admitting: Neurology

## 2020-06-03 ENCOUNTER — Ambulatory Visit (INDEPENDENT_AMBULATORY_CARE_PROVIDER_SITE_OTHER): Payer: BC Managed Care – PPO | Admitting: Neurology

## 2020-06-03 VITALS — BP 133/73 | HR 80 | Ht 63.0 in | Wt 219.3 lb

## 2020-06-03 DIAGNOSIS — E669 Obesity, unspecified: Secondary | ICD-10-CM

## 2020-06-03 DIAGNOSIS — R0602 Shortness of breath: Secondary | ICD-10-CM

## 2020-06-03 DIAGNOSIS — R351 Nocturia: Secondary | ICD-10-CM

## 2020-06-03 DIAGNOSIS — Z9189 Other specified personal risk factors, not elsewhere classified: Secondary | ICD-10-CM

## 2020-06-03 DIAGNOSIS — R0683 Snoring: Secondary | ICD-10-CM

## 2020-06-03 DIAGNOSIS — R002 Palpitations: Secondary | ICD-10-CM

## 2020-06-03 NOTE — Progress Notes (Addendum)
Subjective:    Patient ID: Rhonda Cooley is a 65 y.o. female.  HPI     Rhonda Age, MD, PhD [facility] [address] [place], [place] [address]  Dear Ulice Dash,  I saw your patient, Rhonda Cooley, upon your kind request, in my sleep clinic today for initial consultation of her sleep disorder, in particular, concern for underlying obstructive sleep apnea.  The patient is unaccompanied today.  As you know, [name] is a 65 year old right-handed woman with an underlying medical history of breast cancer, BPPV, endometriosis, lumbar radiculopathy, anxiety, palpitations, chest pain and obesity, who reports snoring and nighttime palpitations, occasional shortness of breath.  She reports that her symptoms have recently improved as she had adjustment to her medications.  She had had blood pressure fluctuations as well which are better.  She has not woken up with a sense of gasping for air.  Her husband sleeps in a separate bedroom.  She lives with her husband and her youngest son, Cooley 64.  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  She goes to bed around 11 and rise time is around 630, typically without an alarm.  She has nocturia about once per average night.  She denies any recurrent morning headaches.  She is a side sleeper or stomach sleeper but typically does not sleep on her back.  Her weight has been fluctuating.  She was able to lose some weight before the pandemic but gained weight during the pandemic.  She recently started a program for weight loss with calorie restriction and just monitoring what she eats.   She does not drink any caffeine.  She does not drink any alcohol.  She quit smoking in 1990.  They have 1 dog in the household.  The dog sleeps in her bedroom but not over bed.  She does not have a TV in her bedroom. I reviewed your office note from 04/29/2020. Her Epworth sleepiness score is 5 out of 24, fatigue severity score is 22  out of 63.  She had recent as well as remote cardiac work-up in the past.  More recently, she had a coronary CT angiogram in May 2021, which showed mild mixed nonobstructive coronary artery disease.  She had an echocardiogram in May 2021, EF was 55 to 60%, left atrial cavity was mildly dilated, moderate mitral regurgitation was seen, normal LV systolic function.  She had a carotid Doppler also in May 2021, no hemodynamically significant stenosis was seen.    Her Past Medical History Is Significant For: Past Medical History:  Diagnosis Date  . Anxiety   . BPPV (benign paroxysmal positional vertigo)   . Breast cancer ([place]) 12/02/2016   right breast  . Chronic headaches   . Complication of anesthesia    Hard to wake up  . Endometriosis    "went in and cleaned"  . Gall stones   . Lumbar radiculopathy   . Palpitations   . Personal history of radiation therapy   . PONV (postoperative nausea and vomiting)    Nausea without vomiting  . Varicose veins     Her Past Surgical History Is Significant For: Past Surgical History:  Procedure Laterality Date  . BACK SURGERY  2014  . BREAST LUMPECTOMY    . BREAST LUMPECTOMY WITH RADIOACTIVE SEED AND SENTINEL LYMPH NODE BIOPSY Right 12/26/2016   Procedure: BREAST LUMPECTOMY WITH RADIOACTIVE SEED AND RIGHT AXILLARY SENTINEL LYMPH NODE BIOPSY;  Surgeon:  Alphonsa Overall, MD;  Location: [place];  Service: General;  Laterality: Right;  . CERVICAL POLYPECTOMY    . COLONOSCOPY W/ POLYPECTOMY    . TONSILLECTOMY AND ADENOIDECTOMY     as child    Her Family History Is Significant For: Family History  Problem Relation Cooley of Onset  . Alzheimer's disease Mother   . Hypertension Sister   . Hypertension Brother     Her Social History Is Significant For: Social History   Socioeconomic History  . Marital status: Married    Spouse name: Marcello Moores  . Number of children: 3  . Years of education: 64  . Highest education level: Not on file  Occupational History     Comment: works from home  Tobacco Use  . Smoking status: Former Smoker    Packs/day: 0.50    Years: 15.00    Pack years: 7.50    Types: Cigarettes    Quit date: 03/08/1988    Years since quitting: 32.2  . Smokeless tobacco: Never Used  Vaping Use  . Vaping Use: Never used  Substance and Sexual Activity  . Alcohol use: No    Alcohol/week: 0.0 standard drinks  . Drug use: No  . Sexual activity: Not Currently  Other Topics Concern  . Not on file  Social History Narrative   Lives at home with husband, child   Caffeine use- tea once a week   Social Determinants of Health   Financial Resource Strain: Not on file  Food Insecurity: Not on file  Transportation Needs: Not on file  Physical Activity: Not on file  Stress: Not on file  Social Connections: Not on file    Her Allergies Are:  No Known Allergies:   Her Current Medications Are:  Outpatient Encounter Medications as of 06/03/2020  Medication Sig  . APPLE CIDER VINEGAR PO Take 1,500 mg by mouth.  Marland Kitchen aspirin 81 MG chewable tablet Chew 81 mg by mouth every other day.  . metoprolol succinate (TOPROL-XL) 25 MG 24 hr tablet Take 25 mg by mouth in the morning and at bedtime.  Marland Kitchen olmesartan-hydrochlorothiazide (BENICAR HCT) 40-12.5 MG tablet Take 1 tablet by mouth daily. (Patient taking differently: Take 1 tablet by mouth daily. 1/2 tablet daily)  . Omega-3 Fatty Acids (FISH OIL) 1000 MG CAPS Take 1,000 mg by mouth daily.  . [DISCONTINUED] metoprolol tartrate (LOPRESSOR) 50 MG tablet Take 1 tablet (50 mg total) by mouth in the morning and at bedtime. (Patient not taking: Reported on 06/03/2020)   No facility-administered encounter medications on file as of 06/03/2020.  :   Review of Systems:  Out of a complete 14 point review of systems, all are reviewed and negative with the exception of these symptoms as listed below:  Review of Systems  Neurological:       Here for sleep consult. Prior sleep study around 2015 and reported  as normal. Pt reports she does snore at night. Epworth Sleepiness Scale 0= would never doze 1= slight chance of dozing 2= moderate chance of dozing 3= high chance of dozing  Sitting and reading:0 Watching TV:2 Sitting inactive in a public place (ex. Theater or meeting):0 As a passenger in a car for an hour without a break:1 Lying down to rest in the afternoon:2 Sitting and talking to someone:0 Sitting quietly after lunch (no alcohol):0 In a car, while stopped in traffic:0 Total:5     Objective:  Neurological Exam  Physical Exam Physical Examination:   Vitals:   06/03/20 1318  BP: 133/73  Pulse: 80   General Examination: The patient is a very pleasant 65 y.o. female in no acute distress. She appears well-developed and well-nourished and well groomed.   HEENT: Normocephalic, atraumatic, pupils are equal, round and reactive to light, extraocular tracking is good without limitation to gaze excursion or nystagmus noted. Hearing is grossly intact. Face is symmetric with normal facial animation. Speech is clear with no dysarthria noted. There is no hypophonia. There is no lip, neck/head, jaw or voice tremor. Neck is supple with full range of passive and active motion. There are no carotid bruits on auscultation. Oropharynx exam reveals: mild mouth dryness, good dental hygiene and mild airway crowding, due to small airway entry.  Tonsils are absent.  Mallampati class II.  Neck circumference is 15-1/2 inches.  She has a mild overbite.  Tongue protrudes centrally and palate elevates symmetrically.  Nasal inspection reveals narrow nasal passages.  Chest: Clear to auscultation without wheezing, rhonchi or crackles noted.  Heart: S1+S2+0, regular and normal without murmurs, rubs or gallops noted.   Abdomen: Soft, non-tender and non-distended with normal bowel sounds appreciated on auscultation.  Extremities: There is no pitting edema in the distal lower extremities bilaterally.   Skin:  Warm and dry without trophic changes noted.   Musculoskeletal: exam reveals no obvious joint deformities, tenderness or joint swelling or erythema.   Neurologically:  Mental status: The patient is awake, alert and oriented in all 4 spheres. Her immediate and remote memory, attention, language skills and fund of knowledge are appropriate. There is no evidence of aphasia, agnosia, apraxia or anomia. Speech is clear with normal prosody and enunciation. Thought process is linear. Mood is normal and affect is normal.  Cranial nerves II - XII are as described above under HEENT exam.  Motor exam: Normal bulk, strength and tone is noted. There is no tremor, Romberg is negative. Fine motor skills and coordination: grossly intact.  Cerebellar testing: No dysmetria or intention tremor. There is no truncal or gait ataxia.  Sensory exam: intact to light touch in the upper and lower extremities.  Gait, station and balance: She stands easily. No veering to one side is noted. No leaning to one side is noted. Posture is Cooley-appropriate and stance is narrow based. Gait shows normal stride length and normal pace. No problems turning are noted. Tandem walk is unremarkable.                Assessment and Plan:   In summary, Anvitha Hutmacher is a very pleasant 66 y.o.-year old female with an underlying medical history of breast cancer, BPPV, endometriosis, lumbar radiculopathy, anxiety, palpitations, chest pain and obesity, whose history and physical exam are concerning for obstructive sleep apnea (OSA). I had a long chat with the patient about my findings and the diagnosis of OSA, its prognosis and treatment options. We talked about medical treatments, surgical interventions and non-pharmacological approaches. I explained in particular the risks and ramifications of untreated moderate to severe OSA, especially with respect to developing cardiovascular disease down the Road, including congestive heart failure, difficult to  treat hypertension, cardiac arrhythmias, or stroke. Even type 2 diabetes has, in part, been linked to untreated OSA. Symptoms of untreated OSA include daytime sleepiness, memory problems, mood irritability and mood disorder such as depression and anxiety, lack of energy, as well as recurrent headaches, especially morning headaches. We talked about trying to maintain a healthy lifestyle in general, as well as the importance of weight control. We also talked about  the importance of good sleep hygiene. I recommended the following at this time: sleep study.   I explained the sleep test procedure to the patient and also outlined possible surgical and non-surgical treatment options of OSA, including the use of a custom-made dental device (which would require a referral to a specialist dentist or oral surgeon), upper airway surgical options, such as traditional UPPP or a novel less invasive surgical option in the form of Inspire hypoglossal nerve stimulation (which would involve a referral to an ENT surgeon). I also explained the CPAP treatment option to the patient, who indicated that she would be willing to try CPAP if the need arises. I explained the importance of being compliant with PAP treatment, not only for insurance purposes but primarily to improve Her symptoms, and for the patient's long term health benefit, including to reduce Her cardiovascular risks. I answered all her questions today and the patient was in agreement. I plan to see her back after the sleep study is completed and encouraged her to call with any interim questions, concerns, problems or updates.   Thank you very much for allowing me to participate in the care of this nice patient. If I can be of any further assistance to you please do not hesitate to call me at [phone].  Sincerely,   Rhonda Age, MD, PhD

## 2020-06-03 NOTE — Patient Instructions (Signed)

## 2020-06-08 ENCOUNTER — Other Ambulatory Visit: Payer: Self-pay | Admitting: Obstetrics and Gynecology

## 2020-06-08 DIAGNOSIS — Z1231 Encounter for screening mammogram for malignant neoplasm of breast: Secondary | ICD-10-CM

## 2020-06-12 ENCOUNTER — Ambulatory Visit: Payer: BC Managed Care – PPO | Admitting: Cardiology

## 2020-06-29 ENCOUNTER — Ambulatory Visit (INDEPENDENT_AMBULATORY_CARE_PROVIDER_SITE_OTHER): Payer: BC Managed Care – PPO | Admitting: Neurology

## 2020-06-29 DIAGNOSIS — G4733 Obstructive sleep apnea (adult) (pediatric): Secondary | ICD-10-CM | POA: Diagnosis not present

## 2020-06-29 DIAGNOSIS — R0602 Shortness of breath: Secondary | ICD-10-CM

## 2020-06-29 DIAGNOSIS — Z9189 Other specified personal risk factors, not elsewhere classified: Secondary | ICD-10-CM

## 2020-06-29 DIAGNOSIS — R002 Palpitations: Secondary | ICD-10-CM

## 2020-06-29 DIAGNOSIS — E669 Obesity, unspecified: Secondary | ICD-10-CM

## 2020-06-29 DIAGNOSIS — R0683 Snoring: Secondary | ICD-10-CM

## 2020-06-29 DIAGNOSIS — R351 Nocturia: Secondary | ICD-10-CM

## 2020-06-30 NOTE — Progress Notes (Signed)
See procedure note.

## 2020-07-03 NOTE — Procedures (Signed)
[facility] (Watch PAT)  STUDY DATE: 06/29/20  DOB: May 25, 1955  MRN: [id number]  ORDERING CLINICIAN: Star Age, MD, PhD   REFERRING CLINICIAN: Dr. Einar Gip   CLINICAL INFORMATION/HISTORY: 65 year old woman with a history of breast cancer, BPPV, endometriosis, lumbar radiculopathy, anxiety, palpitations, chest pain and obesity, who reports snoring and nighttime palpitations, occasional shortness of breath.   Epworth sleepiness score: 5/24.  BMI: 38.7 kg/m  Neck Circumference: 15.5"   FINDINGS:   Total Record Time (hours, min): 8 H 56 min  Total Sleep Time (hours, min):  7 H 47 min   Percent REM (%):    20.01 %   Calculated pAHI (per hour): 13.6       REM pAHI: 22.4   NREM pAHI: 10.8 Supine AHI: N/A   Oxygen Saturation (%) Mean: 94  Minimum oxygen saturation (%):        67%   O2 Saturation Range (%): 67-99  O2Saturation (minutes) <=88%: 0.2 min   Pulse Mean (bpm):    65  Pulse Range (57-96)   IMPRESSION: OSA (obstructive sleep apnea)  RECOMMENDATION:  This home sleep test demonstrates overall mild obstructive sleep apnea with a total AHI of 13.6/hour, with at times significant desaturations, nadir of 67%. Given the patient's medical history and sleep related complaints, treatment with positive airway pressure is recommended. This can be achieved in the form of autoPAP trial/titration at home. A  full night CPAP titration study will help with proper treatment settings and mask fitting if needed. Alternative treatments may include weight loss along with avoidance of the supine sleep position, or an oral appliance in appropriate candidates.   Please note that untreated obstructive sleep apnea may carry additional perioperative morbidity. Patients with significant obstructive sleep apnea should receive perioperative PAP therapy and the surgeons and particularly the anesthesiologist should be informed of the diagnosis and the severity of the sleep  disordered breathing. The patient should be cautioned not to drive, work at heights, or operate dangerous or heavy equipment when tired or sleepy. Review and reiteration of good sleep hygiene measures should be pursued with any patient. Other causes of the patient's symptoms, including circadian rhythm disturbances, an underlying mood disorder, medication effect and/or an underlying medical problem cannot be ruled out based on this test. Clinical correlation is recommended.   The patient and her referring provider will be notified of the test results. The patient will be seen in follow up in sleep clinic at [facility]  I certify that I have reviewed the raw data recording prior to the issuance of this report in accordance with the standards of the American Academy of Sleep Medicine (AASM).  INTERPRETING PHYSICIAN:  Star Age, MD, PhD  Board Certified in Neurology and Sleep Medicine  [facility] [address], [place], [place] [address] [phone]  Sleep Summary  Oxygen Saturation Statistics   Start Study Time: End Study Time: Total Recording Time:        10:35:31 PM 7:31:55 AM 8 h, 56 min  Total Sleep Time % REM of Sleep Time:  7 h, 47 min  22.0    Mean: 94 Minimum: 67 Maximum: 99  Mean of Desaturations Nadirs (%):   90  Oxygen Desatur. %:  4-9 10-20 >20 Total  Events Number Total   20  3 87.0 13.0  0 0.0  23 100.0  Oxygen Saturation: <90 <=88 <85 <80 <70  Duration (minutes): Sleep % 0.2 0.0 0.2 0.1  0.0 0.0 0.1 0.0 0.0 0.0     Respiratory Indices      Total Events REM NREM All Night  pRDI: pAHI 3%:  73  68 22.4 22.4 12.1 10.8 14.6 13.6  ODI 4%: pAHI 4%:  23 28 4.2 4.7 4.6 5.6       Pulse Rate Statistics during Sleep (BPM)      Mean: 65 Minimum: 57 Maximum: 96

## 2020-07-03 NOTE — Progress Notes (Signed)
Patient referred by Dr. Einar Gip, seen by me on 06/03/20, HST on 06/29/20.    Please call and notify the patient that the recent home sleep test showed obstructive sleep apnea. OSA is overall mild, but worth treating to see if she feels better after treatment. To that end I recommend treatment for this in the form of autoPAP, which means, that we don't have to bring her in for a sleep study with CPAP, but will let her try an autoPAP machine at home, through a DME company (of her choice, or as per insurance requirement). The DME representative will educate her on how to use the machine, how to put the mask on, etc. I have placed an order in the chart. Please send referral, talk to patient, send report to referring MD. We will need a FU in sleep clinic for 10 weeks post-PAP set up, please arrange that with me or one of our NPs. Thanks,   Star Age, MD, PhD [facility])

## 2020-07-03 NOTE — Addendum Note (Signed)
Addended by: Star Age on: 07/03/2020 01:40 PM   Modules accepted: Orders

## 2020-07-07 ENCOUNTER — Telehealth: Payer: Self-pay

## 2020-07-07 NOTE — Telephone Encounter (Signed)
Pt order has been sent to [facility]. Further notes to follow

## 2020-07-07 NOTE — Telephone Encounter (Signed)
-----   Message from Star Age, MD sent at 07/03/2020  1:39 PM EDT ----- Patient referred by Dr. Einar Gip, seen by me on 06/03/20, HST on 06/29/20.    Please call and notify the patient that the recent home sleep test showed obstructive sleep apnea. OSA is overall mild, but worth treating to see if she feels better after treatment. To that end I recommend treatment for this in the form of autoPAP, which means, that we don't have to bring her in for a sleep study with CPAP, but will let her try an autoPAP machine at home, through a DME company (of her choice, or as per insurance requirement). The DME representative will educate her on how to use the machine, how to put the mask on, etc. I have placed an order in the chart. Please send referral, talk to patient, send report to referring MD. We will need a FU in sleep clinic for 10 weeks post-PAP set up, please arrange that with me or one of our NPs. Thanks,   Star Age, MD, PhD [facility])

## 2020-07-08 NOTE — Telephone Encounter (Signed)
Late entry-I called pt on 07/07/20. I advised pt that Dr. Rexene Alberts reviewed their sleep study results and found that pt has mild OSA. Dr. Rexene Alberts recommends that pt start autopap for treatment. I reviewed PAP compliance expectations with the pt. Pt is agreeable to starting an auto-PAP. I advised pt that an order will be sent to a DME, [facility], and [facility] will call the pt within about one week after they file with the pt's insurance. [facility] will show the pt how to use the machine, fit for masks, and troubleshoot the auto-PAP if needed. A follow up appt was made for insurance purposes with Dr. Rexene Alberts on 09/30/20 200 pm. Pt verbalized understanding to arrive 15 minutes early and bring their auto-PAP. A letter with all of this information in it will be mailed to the pt as a reminder. I verified with the pt that the address we have on file is correct. Pt verbalized understanding of results. Pt had no questions at this time but was encouraged to call back if questions arise. I have sent the order to [facility] and have received confirmation that they have received the order.

## 2020-07-09 ENCOUNTER — Other Ambulatory Visit: Payer: Self-pay | Admitting: Obstetrics and Gynecology

## 2020-07-09 ENCOUNTER — Other Ambulatory Visit: Payer: Self-pay | Admitting: Adult Health

## 2020-07-09 DIAGNOSIS — Z853 Personal history of malignant neoplasm of breast: Secondary | ICD-10-CM

## 2020-07-14 ENCOUNTER — Ambulatory Visit
Admission: RE | Admit: 2020-07-14 | Discharge: 2020-07-14 | Disposition: A | Discharge status: Home / Self Care | Payer: BC Managed Care – PPO | Source: Ambulatory Visit | Attending: Obstetrics and Gynecology | Admitting: Obstetrics and Gynecology

## 2020-07-14 ENCOUNTER — Other Ambulatory Visit: Payer: Self-pay

## 2020-07-14 ENCOUNTER — Other Ambulatory Visit: Payer: Self-pay | Admitting: Adult Health

## 2020-07-14 DIAGNOSIS — Z853 Personal history of malignant neoplasm of breast: Secondary | ICD-10-CM

## 2020-07-20 ENCOUNTER — Ambulatory Visit: Payer: BC Managed Care – PPO | Admitting: Cardiology

## 2020-09-18 ENCOUNTER — Ambulatory Visit: Payer: BC Managed Care – PPO | Admitting: Cardiology

## 2020-09-30 ENCOUNTER — Ambulatory Visit: Payer: Self-pay | Admitting: Neurology

## 2020-10-06 ENCOUNTER — Other Ambulatory Visit: Payer: Self-pay | Admitting: Family Medicine

## 2020-10-06 ENCOUNTER — Other Ambulatory Visit: Payer: Self-pay | Admitting: Obstetrics and Gynecology

## 2020-10-06 DIAGNOSIS — N644 Mastodynia: Secondary | ICD-10-CM

## 2020-10-22 ENCOUNTER — Ambulatory Visit: Payer: BC Managed Care – PPO | Admitting: Cardiology

## 2020-10-23 ENCOUNTER — Ambulatory Visit
Admission: RE | Admit: 2020-10-23 | Discharge: 2020-10-23 | Disposition: A | Discharge status: Home / Self Care | Payer: BC Managed Care – PPO | Source: Ambulatory Visit | Attending: Obstetrics and Gynecology | Admitting: Obstetrics and Gynecology

## 2020-10-23 ENCOUNTER — Other Ambulatory Visit: Payer: Self-pay | Admitting: Obstetrics and Gynecology

## 2020-10-23 ENCOUNTER — Other Ambulatory Visit: Payer: Self-pay

## 2020-10-23 DIAGNOSIS — N644 Mastodynia: Secondary | ICD-10-CM

## 2020-11-26 ENCOUNTER — Ambulatory Visit: Payer: BC Managed Care – PPO | Admitting: Neurology

## 2020-11-26 ENCOUNTER — Encounter: Payer: Self-pay | Admitting: Adult Health

## 2020-11-26 ENCOUNTER — Ambulatory Visit (INDEPENDENT_AMBULATORY_CARE_PROVIDER_SITE_OTHER): Payer: BC Managed Care – PPO | Admitting: Adult Health

## 2020-11-26 VITALS — BP 142/84 | HR 74 | Ht 63.0 in | Wt 217.0 lb

## 2020-11-26 DIAGNOSIS — G4733 Obstructive sleep apnea (adult) (pediatric): Secondary | ICD-10-CM | POA: Diagnosis not present

## 2020-11-26 DIAGNOSIS — Z9989 Dependence on other enabling machines and devices: Secondary | ICD-10-CM

## 2020-11-26 NOTE — Patient Instructions (Signed)
Continue current nightly use of CPAP  You will continue to follow with your DME company [facility] for supplies and CPAP concerns     Follow up in 6 months or call earlier if needed

## 2020-11-26 NOTE — Progress Notes (Signed)
[facility] [address] [place]. [place] [address] [phone]       OFFICE FOLLOW UP NOTE  Rhonda Cooley Date of Birth:  Mar 07, 1955 Medical Record Number:  [id number]    Primary neurologist: Dr. Rexene Alberts Reason for visit: Initial CPAP follow-up    SUBJECTIVE:   CHIEF COMPLAINT:  Chief Complaint  Patient presents with   Follow-up    RM 2 alone here for f/u on initial cpap. Pt reports she has been doing well, feels better since starting.     HPI:   Update 11/26/2020 JM: Returns for initial CPAP compliance visit.  Completed HST 06/29/2020 with mild OSA with total AHI 13.6/h without time significant desaturations with nadir of 67%.  Recommended to initiate AutoPap for treatment. Started 09/24/2020   Doing well on CPAP tolerating without difficulties.  Sleeping better and feels more rested in the morning.  Still has some mild daytime fatigue but improvement since starting.           Initial consult visit 06/03/2020 Dr. Rexene Alberts [name] is a 65 year old right-handed woman with an underlying medical history of breast cancer, BPPV, endometriosis, lumbar radiculopathy, anxiety, palpitations, chest pain and obesity, who reports snoring and nighttime palpitations, occasional shortness of breath.  She reports that her symptoms have recently improved as she had adjustment to her medications.  She had had blood pressure fluctuations as well which are better.  She has not woken up with a sense of gasping for air.  Her husband sleeps in a separate bedroom.  She lives with her husband and her youngest son, age 64.  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  She goes to bed around 11 and rise time is around 630, typically without an alarm.  She has nocturia about once per average night.  She denies any recurrent morning headaches.  She is a side sleeper or stomach sleeper but typically does not sleep on her back.  Her weight has been fluctuating.   She was able to lose some weight before the pandemic but gained weight during the pandemic.  She recently started a program for weight loss with calorie restriction and just monitoring what she eats.   She does not drink any caffeine.  She does not drink any alcohol.  She quit smoking in 1990.  They have 1 dog in the household.  The dog sleeps in her bedroom but not over bed.  She does not have a TV in her bedroom. I reviewed your office note from 04/29/2020. Her Epworth sleepiness score is 5 out of 24, fatigue severity score is 22 out of 63.  She had recent as well as remote cardiac work-up in the past.  More recently, she had a coronary CT angiogram in May 2021, which showed mild mixed nonobstructive coronary artery disease.  She had an echocardiogram in May 2021, EF was 55 to 60%, left atrial cavity was mildly dilated, moderate mitral regurgitation was seen, normal LV systolic function.  She had a carotid Doppler also in May 2021, no hemodynamically significant stenosis was seen.        ROS:   14 system review of systems performed and negative with exception of those listed in HPI  PMH:  Past Medical History:  Diagnosis Date   Anxiety    BPPV (benign paroxysmal positional vertigo)    Breast cancer ([place]) 12/02/2016   right breast   Chronic headaches    Complication of anesthesia  Hard to wake up   Endometriosis    "went in and cleaned"   Gall stones    Lumbar radiculopathy    Palpitations    Personal history of radiation therapy    PONV (postoperative nausea and vomiting)    Nausea without vomiting   Varicose veins     PSH:  Past Surgical History:  Procedure Laterality Date   BACK SURGERY  2014   BREAST LUMPECTOMY     BREAST LUMPECTOMY WITH RADIOACTIVE SEED AND SENTINEL LYMPH NODE BIOPSY Right 12/26/2016   Procedure: BREAST LUMPECTOMY WITH RADIOACTIVE SEED AND RIGHT AXILLARY SENTINEL LYMPH NODE BIOPSY;  Surgeon: Alphonsa Overall, MD;  Location: [place];  Service: General;   Laterality: Right;   CERVICAL POLYPECTOMY     COLONOSCOPY W/ POLYPECTOMY     TONSILLECTOMY AND ADENOIDECTOMY     as child    Social History:  Social History   Socioeconomic History   Marital status: Married    Spouse name: Marcello Moores   Number of children: 3   Years of education: 14   Highest education level: Not on file  Occupational History    Comment: works from home  Tobacco Use   Smoking status: Former    Packs/day: 0.50    Years: 15.00    Pack years: 7.50    Types: Cigarettes    Quit date: 03/08/1988    Years since quitting: 32.7   Smokeless tobacco: Never  Vaping Use   Vaping Use: Never used  Substance and Sexual Activity   Alcohol use: No    Alcohol/week: 0.0 standard drinks   Drug use: No   Sexual activity: Not Currently  Other Topics Concern   Not on file  Social History Narrative   Lives at home with husband, child   Caffeine use- tea once a week   Social Determinants of Health   Financial Resource Strain: Not on file  Food Insecurity: Not on file  Transportation Needs: Not on file  Physical Activity: Not on file  Stress: Not on file  Social Connections: Not on file  Intimate Partner Violence: Not on file    Family History:  Family History  Problem Relation Age of Onset   Alzheimer's disease Mother    Hypertension Sister    Hypertension Brother     Medications:   Current Outpatient Medications on File Prior to Visit  Medication Sig Dispense Refill   APPLE CIDER VINEGAR PO Take 1,500 mg by mouth.     aspirin 81 MG chewable tablet Chew 81 mg by mouth every other day.     metoprolol succinate (TOPROL-XL) 25 MG 24 hr tablet Take 25 mg by mouth in the morning and at bedtime.     olmesartan-hydrochlorothiazide (BENICAR HCT) 40-12.5 MG tablet Take 1 tablet by mouth daily. (Patient taking differently: Take 1 tablet by mouth daily. 1/2 tablet daily) 30 tablet 3   Omega-3 Fatty Acids (FISH OIL) 1000 MG CAPS Take 1,000 mg by mouth daily.     No current  facility-administered medications on file prior to visit.    Allergies:  No Known Allergies    OBJECTIVE:  Physical Exam  Vitals:   11/26/20 0827  BP: (!) 142/84  Pulse: 74  SpO2: 99%  Weight: 217 lb (98.4 kg)  Height: 5\' 3"  (1.6 m)   Body mass index is 38.44 kg/m. No results found.  General: well developed, well nourished, pleasant middle-age Caucasian female, seated, in no evident distress Head: head normocephalic and atraumatic.  Neck: supple with no carotid or supraclavicular bruits; circumference 16" Cardiovascular: regular rate and rhythm, no murmurs Musculoskeletal: no deformity Skin:  no rash/petichiae Vascular:  Normal pulses all extremities   Neurologic Exam Mental Status: Awake and fully alert. Oriented to place and time. Recent and remote memory intact. Attention span, concentration and fund of knowledge appropriate. Mood and affect appropriate.  Cranial Nerves: Pupils equal, briskly reactive to light. Extraocular movements full without nystagmus. Visual fields full to confrontation. Hearing intact. Facial sensation intact. Face, tongue, palate moves normally and symmetrically.  Motor: Normal bulk and tone. Normal strength in all tested extremity muscles Sensory.: intact to touch , pinprick , position and vibratory sensation.  Coordination: Rapid alternating movements normal in all extremities. Finger-to-nose and heel-to-shin performed accurately bilaterally. Gait and Station: Arises from chair without difficulty. Stance is normal. Gait demonstrates normal stride length and balance without use of assistive device Reflexes: 1+ and symmetric. Toes downgoing.         ASSESSMENT: Rhonda Cooley is a 65 y.o. year old female with PMHx pertinent for hx of breast cancer, BPPV, endometriosis, lumbar radiculopathy, anxiety, palpitations, chest pain and obesity as well as diagnosis of mild sleep apnea on 06/29/2020 with initiation of AutoPap     PLAN:  OSA on  CPAP -Excellent compliance with residual AHI 0.2 -Continue current settings -Discussed importance of continue nightly use -Continue to follow with DME company [facility] for any needed supplies or CPAP related concerns    Follow up in 6 months or call earlier if needed   CC:  Dr. Einar Gip (cardiology) PCP: Nickola Major, MD    I spent 24 minutes of face-to-face and non-face-to-face time with patient.  This included previsit chart review including review of recent hospitalization, study review, order entry, electronic health record documentation, patient education regarding diagnosis of sleep apnea and review and discussion of compliance report, importance of continued nightly usage and answered all other questions to patient satisfaction   Frann Rider, AGNP-BC  [facility] [address] [place], [place] [address]  Phone [phone] Fax [phone] Note: This document was prepared with digital dictation and possible smart phrase technology. Any transcriptional errors that result from this process are unintentional.

## 2020-12-07 ENCOUNTER — Ambulatory Visit: Payer: BC Managed Care – PPO | Admitting: Cardiology

## 2020-12-16 ENCOUNTER — Other Ambulatory Visit: Payer: Self-pay | Admitting: Cardiology

## 2020-12-16 DIAGNOSIS — R002 Palpitations: Secondary | ICD-10-CM

## 2020-12-16 DIAGNOSIS — I10 Essential (primary) hypertension: Secondary | ICD-10-CM

## 2021-01-18 ENCOUNTER — Other Ambulatory Visit: Payer: Self-pay

## 2021-01-18 ENCOUNTER — Encounter: Payer: Self-pay | Admitting: Cardiology

## 2021-01-18 ENCOUNTER — Ambulatory Visit: Payer: BC Managed Care – PPO | Admitting: Cardiology

## 2021-01-18 VITALS — BP 151/75 | HR 71 | Temp 98.0°F | Resp 16 | Ht 63.0 in | Wt 217.0 lb

## 2021-01-18 DIAGNOSIS — I10 Essential (primary) hypertension: Secondary | ICD-10-CM

## 2021-01-18 DIAGNOSIS — G4733 Obstructive sleep apnea (adult) (pediatric): Secondary | ICD-10-CM

## 2021-01-18 DIAGNOSIS — E782 Mixed hyperlipidemia: Secondary | ICD-10-CM

## 2021-01-18 DIAGNOSIS — R0609 Other forms of dyspnea: Secondary | ICD-10-CM

## 2021-01-18 DIAGNOSIS — R002 Palpitations: Secondary | ICD-10-CM

## 2021-01-18 MED ORDER — ROSUVASTATIN CALCIUM 20 MG PO TABS: 20.0000 mg | ORAL_TABLET | Freq: Every day | ORAL | 2 refills | Status: DC

## 2021-01-18 MED ORDER — METOPROLOL SUCCINATE ER 100 MG PO TB24: 100.0000 mg | ORAL_TABLET | Freq: Every day | ORAL | 3 refills | Status: DC

## 2021-01-18 MED ORDER — OLMESARTAN MEDOXOMIL-HCTZ 40-12.5 MG PO TABS: 1.0000 | ORAL_TABLET | Freq: Every day | ORAL | 3 refills | Status: DC

## 2021-01-18 NOTE — Progress Notes (Signed)
Primary Physician/Referring:  Nickola Major, MD  Patient ID: Rhonda Cooley, female    DOB: 01-25-1956, 65 y.o.   MRN: [id number]  Chief Complaint  Patient presents with   Shortness of Breath   Hypertension   Palpitations   Hyperlipidemia   Follow-up    6 weeks   HPI:    Rhonda Cooley  is a 65 y.o. Caucasian female with history of hypertension, hyperlipidemia, hyperglycemia, and breast cancer s/p right breast lumpectomy in 2018 S/P RT and did not want maintenance oral chemotherapy, prior history of tobacco use coronary CTA revealing no significant disease in May 2021 however calcium score was 48 and placed around 79th percentile.    She was seen by me about 2 months ago, with marked dyspnea on exertion and frequent palpitations and uncontrolled hypertension.  Since adding metoprolol, symptoms of palpitations have improved and she states that her blood pressure has been very well controlled at home.  She was also referred for sleep evaluation, has been using CPAP and states that it has made a marked difference in her overall quality of life.  Past Medical History:  Diagnosis Date   Anxiety    BPPV (benign paroxysmal positional vertigo)    Breast cancer ([place]) 12/02/2016   right breast   Chronic headaches    Complication of anesthesia    Hard to wake up   Endometriosis    "went in and cleaned"   Gall stones    Lumbar radiculopathy    Palpitations    Personal history of radiation therapy    PONV (postoperative nausea and vomiting)    Nausea without vomiting   Varicose veins    Past Surgical History:  Procedure Laterality Date   BACK SURGERY  2014   BREAST LUMPECTOMY     BREAST LUMPECTOMY WITH RADIOACTIVE SEED AND SENTINEL LYMPH NODE BIOPSY Right 12/26/2016   Procedure: BREAST LUMPECTOMY WITH RADIOACTIVE SEED AND RIGHT AXILLARY SENTINEL LYMPH NODE BIOPSY;  Surgeon: Alphonsa Overall, MD;  Location: [facility];  Service: General;  Laterality: Right;   CERVICAL POLYPECTOMY      COLONOSCOPY W/ POLYPECTOMY     TONSILLECTOMY AND ADENOIDECTOMY     as child   Family History  Problem Relation Age of Onset   Alzheimer's disease Mother    Hypertension Sister    Hypertension Brother     Social History   Tobacco Use   Smoking status: Former    Packs/day: 0.50    Years: 15.00    Pack years: 7.50    Types: Cigarettes    Quit date: 03/08/1988    Years since quitting: 32.8   Smokeless tobacco: Never  Substance Use Topics   Alcohol use: No    Alcohol/week: 0.0 standard drinks   Marital Status: Married  ROS  Review of Systems  Cardiovascular:  Positive for dyspnea on exertion and palpitations. Negative for chest pain and leg swelling.  Respiratory:  Positive for snoring.   Gastrointestinal:  Negative for melena.  Objective  Blood pressure (!) 151/75, pulse 71, temperature 98 F (36.7 C), resp. rate 16, height $RemoveBe'5\' 3"'GLYFShHdv$  (1.6 m), weight 217 lb (98.4 kg), SpO2 99 %.  Vitals with BMI 01/18/2021 11/26/2020 06/03/2020  Height $Remov'5\' 3"'FQeyWc$  $Remove'5\' 3"'SWMcphb$  $RemoveB'5\' 3"'GupbRVOi$   Weight 217 lbs 217 lbs 219 lbs 5 oz  BMI 38.45 71.24 58.09  Systolic 983 382 505  Diastolic 75 84 73  Pulse 71 74 80     Physical Exam Constitutional:  Appearance: She is well-developed.  HENT:     Head: Atraumatic.  Eyes:     Conjunctiva/sclera: Conjunctivae normal.  Neck:     Thyroid: No thyromegaly.     Vascular: No JVD.  Cardiovascular:     Rate and Rhythm: Normal rate and regular rhythm.     Pulses: Intact distal pulses.          Carotid pulses are  on the right side with bruit and  on the left side with bruit.    Heart sounds: Murmur heard.  Crescendo-decrescendo mid to late systolic murmur is present at the apex.    No gallop.     Comments: Pulsus difficult to feel due to patient's bodily habitus. Pulmonary:     Effort: Pulmonary effort is normal. No accessory muscle usage or respiratory distress.     Breath sounds: Normal breath sounds.  Abdominal:     General: Bowel sounds are normal.     Palpations:  Abdomen is soft.     Comments: Obese and Pannus present   Laboratory examination:   No results for input(s): NA, K, CL, CO2, GLUCOSE, BUN, CREATININE, CALCIUM, GFRNONAA, GFRAA in the last 8760 hours.  CrCl cannot be calculated (Patient's most recent lab result is older than the maximum 21 days allowed.).  CMP Latest Ref Rng & Units 06/19/2019 02/02/2018 06/05/2017  Glucose 70 - 99 mg/dL - 144(H) 154(H)  BUN 8 - 23 mg/dL - 14 14  Creatinine 0.44 - 1.00 mg/dL 0.70 1.00 0.95  Sodium 135 - 145 mmol/L - 140 140  Potassium 3.5 - 5.1 mmol/L - 4.6 3.8  Chloride 98 - 111 mmol/L - 106 107  CO2 22 - 32 mmol/L - 26 23  Calcium 8.9 - 10.3 mg/dL - 9.7 9.7  Total Protein 6.5 - 8.1 g/dL - 7.6 7.7  Total Bilirubin 0.3 - 1.2 mg/dL - 1.3(H) 0.8  Alkaline Phos 38 - 126 U/L - 120 103  AST 15 - 41 U/L - 46(H) 25  ALT 0 - 44 U/L - 80(H) 39   CBC Latest Ref Rng & Units 02/02/2018 06/05/2017 12/23/2016  WBC 4.0 - 10.5 K/uL 8.0 7.8 8.9  Hemoglobin 12.0 - 15.0 g/dL 12.1 12.0 11.8(L)  Hematocrit 36.0 - 46.0 % 37.6 37.0 35.8(L)  Platelets 150 - 400 K/uL 236 222 249   Lipid Panel  No results found for: CHOL, TRIG, HDL, CHOLHDL, VLDL, LDLCALC, LDLDIRECT HEMOGLOBIN A1C No results found for: HGBA1C, MPG TSH No results for input(s): TSH in the last 8760 hours.  External labs:   Basic Metabolic PanelResulted: 09/08/6551 5:46 PM [facility] Component Name Value Ref Range  Sodium 140 135 - 146 MMOL/L  Potassium 3.6 3.5 - 5.3 MMOL/L  Chloride 105 98 - 110 MMOL/L  CO2 27 23 - 30 MMOL/L  BUN 18 8 - 24 MG/DL  Glucose 128 (H)    05/28/2019  Hb 12.8/HCT 38.0, WBC 7.3, platelets 219.  Total cholesterol 242, triglycerides 314, HDL 37, LDL 163.  Non-HDL cholesterol 205.   04/23/2013: CBC, CMP normal except for mildly elevated blood sugar at 113 mg. TSH was normal at 1.18.  06/10/2013: HbA1c 5.8%. TSH was normal.  Lipid Panel: Total cholesterol 276, triglycerides 265, HDL 39, LDL 184. LDL  particle number markedly elevated 1920.  Current Outpatient Medications on File Prior to Visit  Medication Sig Dispense Refill   APPLE CIDER VINEGAR PO Take 1,500 mg by mouth.     aspirin 81 MG chewable tablet  Chew 81 mg by mouth every other day.     Omega-3 Fatty Acids (FISH OIL) 1000 MG CAPS Take 1,000 mg by mouth daily.     predniSONE (DELTASONE) 10 MG tablet Take 5 mg by mouth daily.     No current facility-administered medications on file prior to visit.    Medications and allergies  No Known Allergies   Radiology:   No results found.  Cardiac Studies:   Holter monitor 06/13/2013: Predominant rhythm was normal sinus rhythm. Occasional PACs, symptoms correlated with PACs. Event report: baseline 05/13/10 NSR.  Nuclear stress test Exercise sestamibi stress test 06/10/2013: 1. Resting EKG showed normal sinus rhythm, poor R wave progression, Stress EKG was negative for ischemia. Patient exercised on BRUCE PROTOCOL for 5 minutes 30 seconds. The maximum work level achieved was 7.3 MET's. There was 1.5 mm upsloping ST depression noted in the inferior and lateral leads at peak exercise, which resolved at < 2 minutes into recovery. The test was terminated due to achievement of the target heart rate. 2. Perfusion imaging study demonstrated mild soft tissue attenuation in the inferior wall. There was no e/o ischemia or scar. The left ventricular systolic function was normal at 53%. This is a low risk study.  Coronary CTA 06/19/2019: 1. 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 2. Coronary calcium score of 48. This was 79th percentile for age and sex matched control. 3. Normal coronary origin with right dominance. 4. Aortic annular calcification and aortic atherosclerosis.  Carotid artery duplex 07/02/2019: Color duplex indicates minimal heterogeneous and calcified plaque, with no hemodynamically significant stenosis by duplex criteria in the extracranial cerebrovascular  circulation.  Zio Patch Extended out patient EKG monitoring 14 days starting 05/12/2020: Predominant rhythm is normal sinus rhythm.  Minimum heart rate 57, maximum heart rate 140 bpm with average heart rate of 80 bpm. 2 SVT episodes, atrial tachycardia, brief <6 beats.  There were rare PACs, atrial couplets and triplets. Isolated ventricular ectopy present (2.6% burden).  Rare ventricular triplets and ventricular bigeminy and trigeminy. No symptoms reported.  PCV ECHOCARDIOGRAM COMPLETE 05/12/2020 Left ventricle cavity is normal in size and wall thickness. Normal global wall motion. Normal LV systolic function with EF 69%. Doppler evidence of grade II (pseudonormal) diastolic dysfunction, elevated LAP. Calculated EF 69%. Mild (Grade I) mitral regurgitation. Mild tricuspid regurgitation. No evidence of pulmonary hypertension. No significant change compared to previous study on 06/10/2019.   Sleep study: HST 06/29/2020 with mild OSA with total AHI 13.6/h without time significant desaturations with nadir of 67%.  Recommended to initiate AutoPap for treatment. Started 09/24/2020  EKG  EKG 01/18/2021: Normal sinus rhythm at rate of 70 bpm, left axis deviation, left anterior fascicular block.  Incomplete right bundle branch block.  No significant change from prior EKG.  Assessment     ICD-10-CM   1. Primary hypertension  I10 EKG 12-Lead    metoprolol succinate (TOPROL-XL) 100 MG 24 hr tablet    olmesartan-hydrochlorothiazide (BENICAR HCT) 40-12.5 MG tablet    2. Dyspnea on exertion  R06.09     3. Palpitations  R00.2 metoprolol succinate (TOPROL-XL) 100 MG 24 hr tablet    4. OSA on CPAP  G47.33    Z99.89     5. Mixed hyperlipidemia  E78.2 Lipid Panel With LDL/HDL Ratio     Meds ordered this encounter  Medications   metoprolol succinate (TOPROL-XL) 100 MG 24 hr tablet    Sig: Take 1 tablet (100 mg total) by mouth daily.  Dispense:  100 tablet    Refill:  3    olmesartan-hydrochlorothiazide (BENICAR HCT) 40-12.5 MG tablet    Sig: Take 1 tablet by mouth daily.    Dispense:  100 tablet    Refill:  3   rosuvastatin (CRESTOR) 20 MG tablet    Sig: Take 1 tablet (20 mg total) by mouth daily.    Dispense:  30 tablet    Refill:  2    Medications Discontinued During This Encounter  Medication Reason   metoprolol succinate (TOPROL-XL) 25 MG 24 hr tablet Reorder   olmesartan-hydrochlorothiazide (BENICAR HCT) 40-12.5 MG tablet Reorder     Recommendations:   Rhonda Cooley  is a 65 y.o. Caucasian female with history of hypertension, hyperlipidemia, hyperglycemia, and breast cancer s/p right breast lumpectomy in 2018 S/P RT and did not want maintenance oral chemotherapy, prior history of tobacco use coronary CTA revealing no significant disease in May 2021 however calcium score was 48 and placed around 79th percentile.  She was seen by me about 2 months ago, with marked dyspnea on exertion and frequent palpitations and uncontrolled hypertension.  Since adding metoprolol, symptoms of palpitations have improved and she states that her blood pressure has been very well controlled at home.  It was elevated today here.  She is presently taking 25 mg of metoprolol succinate 3 times a day, will change it to metoprolol succinate 100 mg daily or she can take 1/2 tablet twice daily.  I reviewed the results of the carotid artery duplex that was previously performed and in view of significant plaque buildup, recommended statin therapy.  We will obtain baseline lipids as her lipids was markedly elevated previously.  Once she gets baseline lipids in end of January which she would like to wait till then she will start with Crestor 20 mg daily.  I will repeat again lipid profile testing prior to her next office visit with me in 3 months.  With regard to obstructive sleep apnea, states that she feels the best she has in quite a while with marked improvement in fatigue and overall  wellbeing since being on CPAP.  Reviewed her Zio patch, she has PACs and PVCs.  No A. fib.  Echocardiogram reviewed, mitral regurgitation is only mild.   Adrian Prows, MD, [facility] 01/18/2021, 3:18 PM Office: [phone] Pager: [phone]

## 2021-03-03 ENCOUNTER — Other Ambulatory Visit: Payer: Self-pay

## 2021-03-03 ENCOUNTER — Telehealth: Payer: Self-pay | Admitting: Cardiology

## 2021-03-03 DIAGNOSIS — I10 Essential (primary) hypertension: Secondary | ICD-10-CM

## 2021-03-03 MED ORDER — OLMESARTAN MEDOXOMIL-HCTZ 40-12.5 MG PO TABS: 1.0000 | ORAL_TABLET | Freq: Every day | ORAL | 3 refills | Status: DC

## 2021-03-03 NOTE — Telephone Encounter (Signed)
Patient requesting refill for olmesartan.

## 2021-03-09 ENCOUNTER — Encounter: Payer: Self-pay | Admitting: Cardiology

## 2021-03-09 ENCOUNTER — Other Ambulatory Visit: Payer: Self-pay | Admitting: Cardiology

## 2021-03-09 DIAGNOSIS — E782 Mixed hyperlipidemia: Secondary | ICD-10-CM

## 2021-04-19 ENCOUNTER — Ambulatory Visit: Payer: BC Managed Care – PPO | Admitting: Cardiology

## 2021-05-19 ENCOUNTER — Ambulatory Visit: Payer: BC Managed Care – PPO | Admitting: Cardiology

## 2021-05-25 ENCOUNTER — Ambulatory Visit: Payer: BC Managed Care – PPO | Admitting: Cardiology

## 2021-05-25 LAB — LIPID PANEL WITH LDL/HDL RATIO
Cholesterol, Total: 288 mg/dL — ABNORMAL HIGH (ref 100–199)
HDL: 37 mg/dL — ABNORMAL LOW (ref >39)
LDL Chol Calc (NIH): 200 mg/dL — ABNORMAL HIGH (ref 0–99)
LDL/HDL Ratio: 5.4 ratio — ABNORMAL HIGH (ref 0.0–3.2)
Triglycerides: 258 mg/dL — ABNORMAL HIGH (ref 0–149)
VLDL Cholesterol Cal: 51 mg/dL — ABNORMAL HIGH (ref 5–40)

## 2021-05-27 ENCOUNTER — Ambulatory Visit: Payer: BC Managed Care – PPO | Admitting: Adult Health

## 2021-06-01 ENCOUNTER — Ambulatory Visit: Payer: BC Managed Care – PPO | Admitting: Cardiology

## 2021-06-01 ENCOUNTER — Encounter: Payer: Self-pay | Admitting: Cardiology

## 2021-06-01 VITALS — BP 136/80 | HR 98 | Temp 98.2°F | Resp 16 | Ht 63.0 in | Wt 209.0 lb

## 2021-06-01 DIAGNOSIS — E782 Mixed hyperlipidemia: Secondary | ICD-10-CM

## 2021-06-01 DIAGNOSIS — I6523 Occlusion and stenosis of bilateral carotid arteries: Secondary | ICD-10-CM

## 2021-06-01 DIAGNOSIS — I10 Essential (primary) hypertension: Secondary | ICD-10-CM

## 2021-06-01 DIAGNOSIS — I700 Atherosclerosis of aorta: Secondary | ICD-10-CM

## 2021-06-01 MED ORDER — HYDROCHLOROTHIAZIDE 12.5 MG PO CAPS: 12.5000 mg | ORAL_CAPSULE | ORAL | 0 refills | Status: DC

## 2021-06-01 MED ORDER — ATORVASTATIN CALCIUM 40 MG PO TABS: 40.0000 mg | ORAL_TABLET | Freq: Every day | ORAL | 2 refills | Status: DC

## 2021-06-01 MED ORDER — EZETIMIBE 10 MG PO TABS: 10.0000 mg | ORAL_TABLET | Freq: Every day | ORAL | 3 refills | Status: AC

## 2021-06-01 MED ORDER — OLMESARTAN MEDOXOMIL-HCTZ 40-25 MG PO TABS: 1.0000 | ORAL_TABLET | ORAL | 3 refills | Status: DC

## 2021-06-01 NOTE — Progress Notes (Signed)
? ?Primary Physician/Referring:  Nickola Major, MD ? ?Patient ID: Rhonda Cooley, female    DOB: December 11, 1955, 66 y.o.   MRN: [id number] ? ?No chief complaint on file. ? ?HPI:   ? ?Rhonda Cooley  is a 66 y.o. Caucasian female with history of hypertension, hyperlipidemia, hyperglycemia, and breast cancer s/p right breast lumpectomy in 2018 S/P RT and did not want maintenance oral chemotherapy, prior history of tobacco use coronary CTA revealing no significant disease in May 2021 however calcium score was 48 and placed around 79th percentile.  She also has abdominal aortic atherosclerosis and severe atherosclerosis involving the carotid arteries without high-grade stenosis.  She also has sleep apnea and is on CPAP and has noticed marked improvement in overall wellbeing since being on CPAP. ? ?She presents for a 73-month office visit, states that she has made significant lifestyle changes and has lost about 15 pounds in weight.  Denies chest pain or shortness of breath.  Remains asymptomatic.  Symptoms of palpitations have resolved since being on metoprolol. ? ?Past Medical History:  ?Diagnosis Date  ? Anxiety   ? BPPV (benign paroxysmal positional vertigo)   ? Breast cancer ([place]) 12/02/2016  ? right breast  ? Chronic headaches   ? Complication of anesthesia   ? Hard to wake up  ? Endometriosis   ? "went in and cleaned"  ? Gall stones   ? Hyperlipidemia   ? Lumbar radiculopathy   ? Palpitations   ? Personal history of radiation therapy   ? PONV (postoperative nausea and vomiting)   ? Nausea without vomiting  ? Varicose veins   ? ?Past Surgical History:  ?Procedure Laterality Date  ? BACK SURGERY  2014  ? BREAST LUMPECTOMY    ? BREAST LUMPECTOMY WITH RADIOACTIVE SEED AND SENTINEL LYMPH NODE BIOPSY Right 12/26/2016  ? Procedure: BREAST LUMPECTOMY WITH RADIOACTIVE SEED AND RIGHT AXILLARY SENTINEL LYMPH NODE BIOPSY;  Surgeon: Alphonsa Overall, MD;  Location: [facility];  Service: General;  Laterality: Right;  ? CERVICAL  POLYPECTOMY    ? COLONOSCOPY W/ POLYPECTOMY    ? TONSILLECTOMY AND ADENOIDECTOMY    ? as child  ? ?Family History  ?Problem Relation Age of Onset  ? Alzheimer's disease Mother   ? Hypertension Sister   ? Hypertension Brother   ?  ?Social History  ? ?Tobacco Use  ? Smoking status: Former  ?  Packs/day: 0.50  ?  Years: 15.00  ?  Pack years: 7.50  ?  Types: Cigarettes  ?  Quit date: 03/08/1988  ?  Years since quitting: 33.2  ? Smokeless tobacco: Never  ?Substance Use Topics  ? Alcohol use: No  ?  Alcohol/week: 0.0 standard drinks  ? ?Marital Status: Married  ?ROS  ?Review of Systems  ?Cardiovascular:  Positive for dyspnea on exertion and palpitations. Negative for chest pain and leg swelling.  ?Respiratory:  Positive for snoring.   ?Gastrointestinal:  Negative for melena.  ?Objective  ?Blood pressure 136/80, pulse 98, temperature 98.2 ?F (36.8 ?C), temperature source Temporal, resp. rate 16, height $RemoveBe'5\' 3"'TxCXaJUSJ$  (1.6 m), weight 209 lb (94.8 kg), SpO2 98 %.  ? ?  06/01/2021  ?  3:55 PM 01/18/2021  ?  2:21 PM 11/26/2020  ?  8:27 AM  ?Vitals with BMI  ?Height $Remove'5\' 3"'MSBPXsB$  $RemoveB'5\' 3"'WaktvBSw$  $RemoveBe'5\' 3"'AIjiTRfgJ$   ?Weight 209 lbs 217 lbs 217 lbs  ?BMI 37.03 38.45 38.45  ?Systolic 322 025 427  ?Diastolic 80 75 84  ?Pulse 98 71 74  ?  ?  Physical Exam ?Constitutional:   ?   Appearance: She is well-developed.  ?HENT:  ?   Head: Atraumatic.  ?Eyes:  ?   Conjunctiva/sclera: Conjunctivae normal.  ?Neck:  ?   Thyroid: No thyromegaly.  ?   Vascular: No JVD.  ?Cardiovascular:  ?   Rate and Rhythm: Normal rate and regular rhythm.  ?   Pulses: Intact distal pulses.     ?     Carotid pulses are  on the left side with bruit. ?   Heart sounds: No murmur heard. ?  No gallop.  ?   Comments: Pulsus difficult to feel due to patient's bodily habitus. ?Pulmonary:  ?   Effort: Pulmonary effort is normal. No accessory muscle usage or respiratory distress.  ?   Breath sounds: Normal breath sounds.  ?Abdominal:  ?   General: Bowel sounds are normal.  ?   Palpations: Abdomen is soft.  ?    Comments: Obese and Pannus present  ? ?Laboratory examination:  ? ?No results for input(s): NA, K, CL, CO2, GLUCOSE, BUN, CREATININE, CALCIUM, GFRNONAA, GFRAA in the last 8760 hours. ? ?CrCl cannot be calculated (Patient's most recent lab result is older than the maximum 21 days allowed.).  ? ?  Latest Ref Rng & Units 06/19/2019  ?  4:55 PM 02/02/2018  ? 10:29 AM 06/05/2017  ?  2:19 PM  ?CMP  ?Glucose 70 - 99 mg/dL  144   154    ?BUN 8 - 23 mg/dL  14   14    ?Creatinine 0.44 - 1.00 mg/dL 0.70   1.00   0.95    ?Sodium 135 - 145 mmol/L  140   140    ?Potassium 3.5 - 5.1 mmol/L  4.6   3.8    ?Chloride 98 - 111 mmol/L  106   107    ?CO2 22 - 32 mmol/L  26   23    ?Calcium 8.9 - 10.3 mg/dL  9.7   9.7    ?Total Protein 6.5 - 8.1 g/dL  7.6   7.7    ?Total Bilirubin 0.3 - 1.2 mg/dL  1.3   0.8    ?Alkaline Phos 38 - 126 U/L  120   103    ?AST 15 - 41 U/L  46   25    ?ALT 0 - 44 U/L  80   39    ? ? ?  Latest Ref Rng & Units 02/02/2018  ? 10:29 AM 06/05/2017  ?  2:20 PM 12/23/2016  ?  9:56 AM  ?CBC  ?WBC 4.0 - 10.5 K/uL 8.0   7.8   8.9    ?Hemoglobin 12.0 - 15.0 g/dL 12.1   12.0   11.8    ?Hematocrit 36.0 - 46.0 % 37.6   37.0   35.8    ?Platelets 150 - 400 K/uL 236   222   249    ? ?Lipid Panel  ?   ?Component Value Date/Time  ? CHOL 288 (H) 05/24/2021 0749  ? TRIG 258 (H) 05/24/2021 0749  ? HDL 37 (L) 05/24/2021 0749  ? [place] 200 (H) 05/24/2021 0749  ? ?External labs:  ? ?05/28/2019  ?Hb 12.8/HCT 38.0, WBC 7.3, platelets 219. ? ?Total cholesterol 242, triglycerides 314, HDL 37, LDL 163.  Non-HDL cholesterol 205. ? ?Hb 12.8/HCT 38.0, WBC 7.3, platelets 219. ? ?Total cholesterol 242, triglycerides 314, HDL 37, LDL 163.  Non-HDL cholesterol 205. ?  ?04/23/2013: CBC, CMP normal except for mildly  elevated blood sugar at 113 mg. TSH was normal at 1.18. ? ?06/10/2013: HbA1c 5.8%. TSH was normal.  ?Lipid Panel: Total cholesterol 276, triglycerides 265, HDL 39, LDL 184. LDL particle number markedly elevated 1920. ? ? ?Current Outpatient  Medications:  ?  APPLE CIDER VINEGAR PO, Take 1,500 mg by mouth., Disp: , Rfl:  ?  aspirin 81 MG chewable tablet, Chew 81 mg by mouth every other day., Disp: , Rfl:  ?  atorvastatin (LIPITOR) 40 MG tablet, Take 1 tablet (40 mg total) by mouth daily., Disp: 30 tablet, Rfl: 2 ?  ezetimibe (ZETIA) 10 MG tablet, Take 1 tablet (10 mg total) by mouth daily., Disp: 90 tablet, Rfl: 3 ?  hydrochlorothiazide (MICROZIDE) 12.5 MG capsule, Take 1 capsule (12.5 mg total) by mouth every morning., Disp: 50 capsule, Rfl: 0 ?  metoprolol succinate (TOPROL-XL) 100 MG 24 hr tablet, Take 1 tablet (100 mg total) by mouth daily., Disp: 100 tablet, Rfl: 3 ?  olmesartan-hydrochlorothiazide (BENICAR HCT) 40-12.5 MG tablet, Take 1 tablet by mouth daily., Disp: 100 tablet, Rfl: 3 ?  [START ON 08/02/2021] olmesartan-hydrochlorothiazide (BENICAR HCT) 40-25 MG tablet, Take 1 tablet by mouth every morning., Disp: 90 tablet, Rfl: 3 ?  Omega-3 Fatty Acids (FISH OIL) 1000 MG CAPS, Take 1,000 mg by mouth daily., Disp: , Rfl:   ?  ?Medications and allergies  ?No Known Allergies  ? ?Radiology:  ? ?No results found. ? ?Cardiac Studies:  ? ?Holter monitor 06/13/2013: Predominant rhythm was normal sinus rhythm. Occasional PACs, symptoms correlated with PACs. Event report: baseline 05/13/10 NSR. ? ?Nuclear stress test Exercise sestamibi stress test 06/10/2013: ?1. Resting EKG showed normal sinus rhythm, poor R wave progression, Stress EKG was negative for ischemia. Patient exercised on BRUCE PROTOCOL for 5 minutes 30 seconds. The maximum work level achieved was 7.3 MET's. There was 1.5 mm upsloping ST depression noted in the inferior and lateral leads at peak exercise, which resolved at < 2 minutes into recovery. The test was terminated due to achievement of the target heart rate. ?2. Perfusion imaging study demonstrated mild soft tissue attenuation in the inferior wall. There was no e/o ischemia or scar. The left ventricular systolic function was normal at  53%. This is a low risk study. ? ?Coronary CTA 06/19/2019: ?1. 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 ?2. Coronary calcium score of 48. This was 79th percentile for age and sex matched control.

## 2021-06-01 NOTE — Patient Instructions (Signed)
Please get blood work done in 2 to 3 months on a fasting state for 6 to 8 hours. ? ?Please complete taking Benicar HCT 40/12.5 mg plus HCTZ 12.5 mg daily and when one of the tablets is completed, discontinue the other 1. ? ?Then restart new Rx Benicar HCT 40/25 mg in the morning. ?

## 2021-08-31 ENCOUNTER — Ambulatory Visit: Payer: BC Managed Care – PPO | Admitting: Student

## 2021-11-03 ENCOUNTER — Ambulatory Visit: Payer: BC Managed Care – PPO | Admitting: Cardiology

## 2022-02-14 ENCOUNTER — Other Ambulatory Visit: Payer: Self-pay | Admitting: Obstetrics and Gynecology

## 2022-02-14 DIAGNOSIS — R928 Other abnormal and inconclusive findings on diagnostic imaging of breast: Secondary | ICD-10-CM

## 2022-02-23 ENCOUNTER — Ambulatory Visit: Payer: BC Managed Care – PPO

## 2022-02-23 ENCOUNTER — Ambulatory Visit
Admission: RE | Admit: 2022-02-23 | Discharge: 2022-02-23 | Disposition: A | Discharge status: Home / Self Care | Payer: Medicare Other | Source: Ambulatory Visit | Attending: Obstetrics and Gynecology | Admitting: Obstetrics and Gynecology

## 2022-02-23 DIAGNOSIS — R928 Other abnormal and inconclusive findings on diagnostic imaging of breast: Secondary | ICD-10-CM

## 2022-03-15 ENCOUNTER — Other Ambulatory Visit: Payer: Self-pay | Admitting: Cardiology

## 2022-03-15 DIAGNOSIS — I10 Essential (primary) hypertension: Secondary | ICD-10-CM

## 2022-05-10 ENCOUNTER — Telehealth: Payer: Self-pay | Admitting: Adult Health

## 2022-05-10 NOTE — Telephone Encounter (Signed)
Pt hasn't been seen since 11/26/2020 must make appt 1st. Routing to the phone room to schedule appt

## 2022-05-10 NOTE — Telephone Encounter (Signed)
Pt called. Requesting a prescription for CPAP supplies.

## 2022-05-16 ENCOUNTER — Ambulatory Visit (INDEPENDENT_AMBULATORY_CARE_PROVIDER_SITE_OTHER): Payer: Medicare Other | Admitting: Neurology

## 2022-05-16 ENCOUNTER — Encounter: Payer: Self-pay | Admitting: Neurology

## 2022-05-16 VITALS — BP 144/69 | HR 73 | Ht 63.0 in | Wt 216.8 lb

## 2022-05-16 DIAGNOSIS — G4733 Obstructive sleep apnea (adult) (pediatric): Secondary | ICD-10-CM

## 2022-05-16 NOTE — Patient Instructions (Addendum)
It was nice to see you again today. I am glad to hear, you were doing well with your autoPAP before. Please talk to your DME provider about getting replacement supplies on a regular basis. Please be sure to change your filter every month, your mask about every 3 months, hose about every 6 months, humidifier chamber about yearly. Some restrictions are imposed by your insurance carrier with regard to how frequently you can get certain supplies.  Your DME company can provide further details if necessary.  I will write for new supplies.    Please continue using your autoPAP regularly. While your insurance requires that you use PAP at least 4 hours each night on 70% of the nights, I recommend, that you not skip any nights and use it throughout the night if you can. Getting used to PAP and staying with the treatment long term does take time and patience and discipline. Untreated obstructive sleep apnea when it is moderate to severe can have an adverse impact on cardiovascular health and raise her risk for heart disease, arrhythmias, hypertension, congestive heart failure, stroke and diabetes. Untreated obstructive sleep apnea causes sleep disruption, nonrestorative sleep, and sleep deprivation. This can have an impact on your day to day functioning and cause daytime sleepiness and impairment of cognitive function, memory loss, mood disturbance, and problems focussing. Using PAP regularly can improve these symptoms.  We can see you in 1 year, you can see one of our nurse practitioners as you are stable.

## 2022-05-16 NOTE — Progress Notes (Signed)
Subjective:    Patient ID: Rhonda Cooley is a 67 y.o. female.  HPI    Interim history:   [name] is a 67 year old right-handed woman with an underlying medical history of breast cancer, BPPV (seen by Dr. Marjory Lies for this in the past), endometriosis, lumbar radiculopathy, anxiety, palpitations, chest pain and obesity, who presents for follow-up consultation of her obstructive sleep apnea.  The patient is unaccompanied today.  She was last seen in this office by Ihor Austin, NP on 11/26/2020, at which time she was compliant with her AutoPap of 5 to 15 cm.  I first met her at the request of her cardiologist on 06/03/2020, at which time she reported snoring and nighttime palpitations.  Her home sleep test from 06/29/2020 showed overall mild obstructive sleep apnea with a total AHI of 13.6/hour, with at times significant desaturations, nadir of 67%.  She was advised to proceed with home AutoPap therapy.  Today, 05/16/2022: An AutoPap compliance data report is not available for my review today.  She has not had an AutoPap machine in over 1 year.  She would like to get back on treatment.  She needs new supplies.  She reports that her machine got lost and she finally got it but has not had any recent supplies.  She benefited from treatment and would like to get back on track with them.  Bedtime is generally around 11 and rise time around 7.  She retired about a month ago.  When she used her AutoPap consistently she felt better rested and had less palpitations.  Her weight has been more or less stable.  She does not drink caffeine, she drinks decaf coffee in the morning and otherwise water.  The patient's allergies, current medications, family history, past medical history, past social history, past surgical history and problem list were reviewed and updated as appropriate.   Previously:  06/03/20: (She) reports snoring and nighttime palpitations, occasional shortness of breath.  She reports that her  symptoms have recently improved as she had adjustment to her medications.  She had had blood pressure fluctuations as well which are better.  She has not woken up with a sense of gasping for air.  Her husband sleeps in a separate bedroom.  She lives with her husband and her youngest son, age 55.  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  She goes to bed around 11 and rise time is around 630, typically without an alarm.  She has nocturia about once per average night.  She denies any recurrent morning headaches.  She is a side sleeper or stomach sleeper but typically does not sleep on her back.  Her weight has been fluctuating.  She was able to lose some weight before the pandemic but gained weight during the pandemic.  She recently started a program for weight loss with calorie restriction and just monitoring what she eats.   She does not drink any caffeine.  She does not drink any alcohol.  She quit smoking in 1990.  They have 1 dog in the household.  The dog sleeps in her bedroom but not over bed.  She does not have a TV in her bedroom. I reviewed your office note from 04/29/2020. Her Epworth sleepiness score is 5 out of 24, fatigue severity score is 22 out of 63.  She had recent as well as remote cardiac work-up in the past.  More recently, she had a coronary CT angiogram in  May 2021, which showed mild mixed nonobstructive coronary artery disease.  She had an echocardiogram in May 2021, EF was 55 to 60%, left atrial cavity was mildly dilated, moderate mitral regurgitation was seen, normal LV systolic function.  She had a carotid Doppler also in May 2021, no hemodynamically significant stenosis was seen.       Her Past Medical History Is Significant For: Past Medical History:  Diagnosis Date   Anxiety    BPPV (benign paroxysmal positional vertigo)    Breast cancer 12/02/2016   right breast   Chronic headaches    Complication of anesthesia    Hard to wake up   Endometriosis     "went in and cleaned"   Gall stones    Hyperlipidemia    Lumbar radiculopathy    Palpitations    Personal history of radiation therapy    PONV (postoperative nausea and vomiting)    Nausea without vomiting   Varicose veins     Her Past Surgical History Is Significant For: Past Surgical History:  Procedure Laterality Date   BACK SURGERY  2014   BREAST LUMPECTOMY     BREAST LUMPECTOMY WITH RADIOACTIVE SEED AND SENTINEL LYMPH NODE BIOPSY Right 12/26/2016   Procedure: BREAST LUMPECTOMY WITH RADIOACTIVE SEED AND RIGHT AXILLARY SENTINEL LYMPH NODE BIOPSY;  Surgeon: Ovidio Kin, MD;  Location: [facility] OR;  Service: General;  Laterality: Right;   CERVICAL POLYPECTOMY     COLONOSCOPY W/ POLYPECTOMY     TONSILLECTOMY AND ADENOIDECTOMY     as child    Her Family History Is Significant For: Family History  Problem Relation Age of Onset   Alzheimer's disease Mother    Hypertension Sister    Hypertension Brother     Her Social History Is Significant For: Social History   Socioeconomic History   Marital status: Married    Spouse name: Rhonda Cooley   Number of children: 3   Years of education: 14   Highest education level: Not on file  Occupational History    Comment: works from home  Tobacco Use   Smoking status: Former    Packs/day: 0.50    Years: 15.00    Additional pack years: 0.00    Total pack years: 7.50    Types: Cigarettes    Quit date: 03/08/1988    Years since quitting: 34.2   Smokeless tobacco: Never  Vaping Use   Vaping Use: Never used  Substance and Sexual Activity   Alcohol use: No    Alcohol/week: 0.0 standard drinks of alcohol   Drug use: No   Sexual activity: Not Currently  Other Topics Concern   Not on file  Social History Narrative   Lives at home with husband, child   Caffeine use- tea once a week   Social Determinants of Health   Financial Resource Strain: Not on file  Food Insecurity: Not on file  Transportation Needs: Not on file  Physical  Activity: Not on file  Stress: Not on file  Social Connections: Not on file    Her Allergies Are:  No Known Allergies:   Her Current Medications Are:  Outpatient Encounter Medications as of 05/16/2022  Medication Sig   APPLE CIDER VINEGAR PO Take 1,500 mg by mouth.   aspirin 81 MG chewable tablet Chew 81 mg by mouth every other day.   ezetimibe (ZETIA) 10 MG tablet Take 1 tablet (10 mg total) by mouth daily.   metoprolol succinate (TOPROL-XL) 100 MG 24 hr tablet Take 1 tablet (  100 mg total) by mouth daily.   olmesartan-hydrochlorothiazide (BENICAR HCT) 40-12.5 MG tablet Take 1 tablet by mouth once daily   Omega-3 Fatty Acids (FISH OIL) 1000 MG CAPS Take 1,000 mg by mouth daily.   [DISCONTINUED] olmesartan-hydrochlorothiazide (BENICAR HCT) 40-25 MG tablet Take 1 tablet by mouth every morning.   [DISCONTINUED] atorvastatin (LIPITOR) 40 MG tablet Take 1 tablet (40 mg total) by mouth daily.   [DISCONTINUED] hydrochlorothiazide (MICROZIDE) 12.5 MG capsule Take 1 capsule (12.5 mg total) by mouth every morning.   No facility-administered encounter medications on file as of 05/16/2022.  :  Review of Systems:  Out of a complete 14 point review of systems, all are reviewed and negative with the exception of these symptoms as listed below:  Review of Systems  Neurological:        Here to resume therapy.  Without machine for over a year.  Wants to restart.  ESS 4.       Objective:  Neurological Exam  Physical Exam Physical Examination:   Vitals:   05/16/22 1328  BP: (!) 144/69  Pulse: 73    General Examination: The patient is a very pleasant 67 y.o. female in no acute distress. She appears well-developed and well-nourished and well groomed.   HEENT: Normocephalic, atraumatic, pupils are equal, round and reactive to light, corrective eyeglasses in place.  No obvious nystagmus noted.  Hearing grossly intact. Face is symmetric with normal facial animation. Speech is clear with no  dysarthria noted. There is no hypophonia. There is no lip, neck/head, jaw or voice tremor. Neck is supple with full range of passive and active motion. There are no carotid bruits on auscultation. Oropharynx exam reveals: mild mouth dryness, good dental hygiene and mild airway crowding, due to small airway entry. Tongue protrudes centrally and palate elevates symmetrically.     Chest: Clear to auscultation without wheezing, rhonchi or crackles noted.   Heart: S1+S2+0, regular and normal without murmurs, rubs or gallops noted.    Abdomen: Soft, non-tender and non-distended.   Extremities: There is no pitting edema in the distal lower extremities bilaterally.    Skin: Warm and dry without trophic changes noted.    Musculoskeletal: exam reveals no obvious joint deformities.    Neurologically:  Mental status: The patient is awake, alert and oriented in all 4 spheres. Her immediate and remote memory, attention, language skills and fund of knowledge are appropriate. There is no evidence of aphasia, agnosia, apraxia or anomia. Speech is clear with normal prosody and enunciation. Thought process is linear. Mood is normal and affect is normal.  Cranial nerves II - XII are as described above under HEENT exam.  Motor exam: Normal bulk, strength and tone is noted. There is no obvious action or resting tremor. Fine motor skills and coordination: grossly intact.  Cerebellar testing: No dysmetria or intention tremor. There is no truncal or gait ataxia.  Sensory exam: intact to light touch in the upper and lower extremities.  Gait, station and balance: She stands easily. No veering to one side is noted. No leaning to one side is noted. Posture is age-appropriate and stance is narrow based. Gait shows normal stride length and normal pace. No problems turning are noted.                 Assessment and Plan:    In summary, Jimin Stine is a very pleasant 67 year old right-handed woman with an underlying medical  history of breast cancer, BPPV (seen by Dr. Marjory Lies for  this in the past), endometriosis, lumbar radiculopathy, anxiety, palpitations, chest pain and obesity, who presents for follow-up consultation of her obstructive sleep apnea. Her home sleep test from 06/29/2020 showed overall mild obstructive sleep apnea with a total AHI of 13.6/hour, with at times significant desaturations, nadir of 67%.  She was on AutoPap therapy and was compliant with treatment but lost her machine for about a year, recently got her machine back.  She needs new supplies which I would be happy to prescribe.  She is advised to get back on her AutoPap machine consistently and follow-up routinely in 1 year, she can see one of our nurse practitioners again.  She has benefited from treatment in the past and would like to get back on it and is very motivated to use her AutoPap machine.   I answered all her questions today and the patient was in agreement.  I spent 30 minutes in total face-to-face time and in reviewing records during pre-charting, more than 50% of which was spent in counseling and coordination of care, reviewing test results, reviewing medications and treatment regimen and/or in discussing or reviewing the diagnosis of OSA, the prognosis and treatment options. Pertinent laboratory and imaging test results that were available during this visit with the patient were reviewed by me and considered in my medical decision making (see chart for details).

## 2022-05-23 NOTE — Progress Notes (Signed)
Guy Begin, RN  Rhonda Cooley Rhonda Cooley, [place]; Rhonda Cooley; Rhonda Cooley, Rhonda Cooley Good morning  New order in epic renew cpap supplies   [place]. [place] Female, 67 y.o., March 18, 1955 MRN: [id number]   Thank you,  Andrey Campanile RN

## 2022-06-11 ENCOUNTER — Other Ambulatory Visit: Payer: Self-pay | Admitting: Cardiology

## 2022-06-11 DIAGNOSIS — I10 Essential (primary) hypertension: Secondary | ICD-10-CM

## 2023-04-19 ENCOUNTER — Ambulatory Visit: Payer: Medicare Other | Attending: Cardiology | Admitting: Cardiology

## 2023-04-21 ENCOUNTER — Encounter: Payer: Self-pay | Admitting: Cardiology

## 2023-09-07 ENCOUNTER — Telehealth: Payer: Self-pay | Admitting: Neurology

## 2023-09-07 NOTE — Telephone Encounter (Signed)
 Patient called month ago lost CPAP while visiting someone and can not find it. Would like call back to know what to do.  Informed patient of the phone number for [employer]. Patient said she would call the company.

## 2023-09-07 NOTE — Telephone Encounter (Signed)
 Spoke with patient. She is waiting to hear back from [employer] as to whether or not they have a loaner machine. I asked her to keep us  updated. If they do not have a loaner, may discuss with Dr Buck possible oral appliance referral to dentistry until she can get another cpap machine in 2027. Pt thanked me for the call.

## 2023-11-03 NOTE — Progress Notes (Signed)
 Cardiology Office Note   Date:  11/06/2023  ID:  Rhonda Cooley, DOB 1956/02/01, MRN [id number] PCP: Rhonda Lucie LABOR, MD  [facility] Providers Cardiologist:  Gordy Bergamo, MD   History of Present Illness Rhonda Cooley is a 68 y.o. female with a past medical history of hypertension, hyperlipidemia, hyperglycemia, and breast cancer status post right breast lumpectomy in 2018 status post RT and did not want maintenance oral chemotherapy, prior history of tobacco use, coronary CTA revealing no significant disease May 2021 but calcium  score was 48 which placed her around 79th percentile.  She also has a history of abdominal aortic atherosclerosis and severe atherosclerosis involving the carotid arteries without high-grade stenosis.  Also had sleep apnea and is on CPAP noticed a marked improvement in overall wellbeing since being on CPAP.  She presents today for overdue follow-up appointment.  She was seen a year ago through [facility] cardiology but was last seen by Dr. Bergamo in 2023.  She had made significant life changes back then lost about 15 pounds.  Denies chest pain or shortness of breath.  Remained asymptomatic.  Symptoms of palpitations had resolved since being on metoprolol .  Today, she presents with a hx of hypertension and diabetes who presents with concerns about blood pressure management and eye symptoms. She was referred to a neuro-ophthalmologist for her eye symptoms.  Since October 2024, she has struggled with blood pressure management. Initially prescribed olmesartan  5 mg twice daily, she takes it once daily due to frequent hypotension. Her blood pressure is often described as very low. She experienced a blood vessel rupture in her eye, potentially linked to blood pressure fluctuations.  She has a history of palpitations, previously managed with metoprolol , which she discontinued due to hypotension. Her heart rate remains elevated, which is bothersome. She was on 50 mg of  metoprolol , reduced from an initial 100 mg dose.  A CT of her coronary arteries in 2021 showed calcification. She exercises using a bike and is working on her stamina. She experiences mild dyspnea during exercise but denies current chest pain  Vasculature: Atherosclerotic calcifications of the aorta and its branches. On recent imaging.   Reports no chest pain, pressure, or tightness. No edema, orthopnea, PND. Reports no palpitations.   Discussed the use of AI scribe software for clinical note transcription with the patient, who gave verbal consent to proceed.   ROS: pertinent ROS in HPI  Studies Reviewed EKG Interpretation Date/Time:  Monday November 06 2023 08:31:11 EDT Ventricular Rate:  92 PR Interval:  152 QRS Duration:  124 QT Interval:  388 QTC Calculation: 479 R Axis:   -61  Text Interpretation: Normal sinus rhythm Right bundle branch block Left anterior fascicular block Bifascicular block When compared with ECG of 05-Mar-2016 16:53, PREVIOUS ECG IS PRESENT Confirmed by Rhonda Cooley [phone]) on 11/06/2023 12:51:14 PM  (RBBB old on 2021 EKG)  Holter monitor 06/13/2013: Predominant rhythm was normal sinus rhythm. Occasional PACs, symptoms correlated with PACs. Event report: baseline 05/13/10 NSR.   Nuclear stress test Exercise sestamibi stress test 06/10/2013: 1. Resting EKG showed normal sinus rhythm, poor R wave progression, Stress EKG was negative for ischemia. Patient exercised on BRUCE PROTOCOL for 5 minutes 30 seconds. The maximum work level achieved was 7.3 MET's. There was 1.5 mm upsloping ST depression noted in the inferior and lateral leads at peak exercise, which resolved at < 2 minutes into recovery. The test was terminated due to achievement of the target heart rate. 2. Perfusion imaging  study demonstrated mild soft tissue attenuation in the inferior wall. There was no e/o ischemia or scar. The left ventricular systolic function was normal at 53%. This is a low risk  study.   Coronary CTA 06/19/2019: 1. 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 2. Coronary calcium  score of 48. This was 79th percentile for age and sex matched control. 3. Normal coronary origin with right dominance. 4. Aortic annular calcification and aortic atherosclerosis.   Carotid artery duplex 07/02/2019: Color duplex indicates minimal heterogeneous and calcified plaque, with no hemodynamically significant stenosis by duplex criteria in the extracranial cerebrovascular circulation.   Zio Patch Extended out patient EKG monitoring 14 days starting 05/12/2020: Predominant rhythm is normal sinus rhythm.  Minimum heart rate 57, maximum heart rate 140 bpm with average heart rate of 80 bpm. 2 SVT episodes, atrial tachycardia, brief <6 beats.  There were rare PACs, atrial couplets and triplets. Isolated ventricular ectopy present (2.6% burden).  Rare ventricular triplets and ventricular bigeminy and trigeminy. No symptoms reported.   PCV ECHOCARDIOGRAM COMPLETE 05/12/2020 Left ventricle cavity is normal in size and wall thickness. Normal global wall motion. Normal LV systolic function with EF 69%. Doppler evidence of grade II (pseudonormal) diastolic dysfunction, elevated LAP. Calculated EF 69%. Mild (Grade I) mitral regurgitation. Mild tricuspid regurgitation. No evidence of pulmonary hypertension. No significant change compared to previous study on 06/10/2019.    Sleep study: HST 06/29/2020 with mild OSA with total AHI 13.6/h without time significant desaturations with nadir of 67%.  Recommended to initiate AutoPap for treatment. 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 Left carotid artery stenosis with bruit. Coronary artery disease risk due to calcifications. - Order ultrasound of neck to assess carotid artery stenosis. - Start low-dose Crestor  for cholesterol management and calcification prevention. - Order echocardiogram to assess heart valve function and diastolic dysfunction.  Diastolic dysfunction with mild mitral and tricuspid regurgitation Diastolic dysfunction with mild mitral and tricuspid regurgitation. Murmur suggests potential valve disease progression. - Order echocardiogram to assess current status of diastolic dysfunction and valve regurgitation.  Palpitations and elevated heart rate Palpitations and elevated heart rate. Metoprolol  previously discontinued due to low blood pressure. Current heart rate in 90s, increasing cardiac workload. - Start low-dose metoprolol  to manage heart rate. - Monitor heart rate and blood pressure regularly.  Essential hypertension Blood pressure low with current olmesartan  regimen. Adjustment needed due to cardiovascular disease. - Reduce olmesartan  to 2.5 mg once daily with option for additional 2.5 mg if blood pressure rises. - Monitor blood pressure regularly, especially after medication adjustments.  Mixed hyperlipidemia Previous LDL significantly above goal at 200 mg/dL. High LDL contributes to atherosclerotic disease progression. - Start low-dose Crestor  to  manage cholesterol levels. - Check lipid panel in two months with primary care provider.  Exercise intolerance Exercise intolerance possibly due  to cardiovascular issues or deconditioning. - Encourage continued exercise to improve tolerance and cardiovascular health. - Evaluate exercise tolerance after echocardiogram results.    Dispo: She can follow-up in 6 months with Dr. Ladona  Signed, Orren LOISE Fabry, PA-C

## 2023-11-06 ENCOUNTER — Other Ambulatory Visit: Payer: Self-pay | Admitting: Physician Assistant

## 2023-11-06 ENCOUNTER — Encounter: Payer: Self-pay | Admitting: Physician Assistant

## 2023-11-06 ENCOUNTER — Ambulatory Visit: Attending: Physician Assistant | Admitting: Physician Assistant

## 2023-11-06 VITALS — BP 118/70 | HR 93 | Ht 63.0 in | Wt 203.6 lb

## 2023-11-06 DIAGNOSIS — I10 Essential (primary) hypertension: Secondary | ICD-10-CM

## 2023-11-06 DIAGNOSIS — I5189 Other ill-defined heart diseases: Secondary | ICD-10-CM

## 2023-11-06 DIAGNOSIS — I700 Atherosclerosis of aorta: Secondary | ICD-10-CM | POA: Insufficient documentation

## 2023-11-06 DIAGNOSIS — I2510 Atherosclerotic heart disease of native coronary artery without angina pectoris: Secondary | ICD-10-CM

## 2023-11-06 DIAGNOSIS — I779 Disorder of arteries and arterioles, unspecified: Secondary | ICD-10-CM

## 2023-11-06 DIAGNOSIS — E782 Mixed hyperlipidemia: Secondary | ICD-10-CM | POA: Diagnosis present

## 2023-11-06 DIAGNOSIS — I340 Nonrheumatic mitral (valve) insufficiency: Secondary | ICD-10-CM

## 2023-11-06 MED ORDER — ROSUVASTATIN CALCIUM 5 MG PO TABS: 5.0000 mg | ORAL_TABLET | Freq: Every day | ORAL | 1 refills | Status: AC

## 2023-11-06 MED ORDER — METOPROLOL SUCCINATE ER 25 MG PO TB24: 25.0000 mg | ORAL_TABLET | Freq: Every day | ORAL | 1 refills | Status: AC

## 2023-11-06 MED ORDER — OLMESARTAN MEDOXOMIL 5 MG PO TABS: 2.5000 mg | ORAL_TABLET | Freq: Every day | ORAL | 1 refills | Status: AC

## 2023-11-06 NOTE — Patient Instructions (Addendum)
 Medication Instructions:  DECREASE Benicar  to 2.5mg  Take 1 tablet once a day START Toprol  XL 25mg  Take 1 tablet once a day in the evenings START Crestor  5mg  Take 1 tablet daily in the evenings *If you need a refill on your cardiac medications before your next appointment, please call your pharmacy*  Lab Work: Complete labs at your primary care providers office. Make sure you complete a fasting lipid panel and send a copy of lab work to our office. If you have labs (blood work) drawn today and your tests are completely normal, you will receive your results only by: MyChart Message (if you have MyChart) OR A paper copy in the mail If you have any lab test that is abnormal or we need to change your treatment, we will call you to review the results.  Testing/Procedures: Your physician has requested that you have a carotid duplex. This test is an ultrasound of the carotid arteries in your neck. It looks at blood flow through these arteries that supply the brain with blood. Allow one hour for this exam. There are no restrictions or special instructions.  Your physician has requested that you have an echocardiogram. Echocardiography is a painless test that uses sound waves to create images of your heart. It provides your doctor with information about the size and shape of your heart and how well your heart's chambers and valves are working. This procedure takes approximately one hour. There are no restrictions for this procedure. Please do NOT wear cologne, perfume, aftershave, or lotions (deodorant is allowed). Please arrive 15 minutes prior to your appointment time.  Please note: We ask at that you not bring children with you during ultrasound (echo/ vascular) testing. Due to room size and safety concerns, children are not allowed in the ultrasound rooms during exams. Our front office staff cannot provide observation of children in our lobby area while testing is being conducted. An adult accompanying  a patient to their appointment will only be allowed in the ultrasound room at the discretion of the ultrasound technician under special circumstances. We apologize for any inconvenience.  Follow-Up: At [facility], you and your health needs are our priority.  As part of our continuing mission to provide you with exceptional heart care, our providers are all part of one team.  This team includes your primary Cardiologist (physician) and Advanced Practice Providers or APPs (Physician Assistants and Nurse Practitioners) who all work together to provide you with the care you need, when you need it.  Your next appointment:   6 month(s)  Provider:   Gordy Bergamo, MD   We recommend signing up for the patient portal called MyChart.  Sign up information is provided on this After Visit Summary.  MyChart is used to connect with patients for Virtual Visits (Telemedicine).  Patients are able to view lab/test results, encounter notes, upcoming appointments, etc.  Non-urgent messages can be sent to your provider as well.   To learn more about what you can do with MyChart, go to [email or web address].   Other Instructions

## 2023-11-08 ENCOUNTER — Telehealth: Payer: Self-pay | Admitting: Cardiology

## 2023-11-08 NOTE — Telephone Encounter (Signed)
 RX sent in on 11/06/23. Confirmed by Pharmacy. Called Pt and Pt verbalized understanding.

## 2023-11-08 NOTE — Telephone Encounter (Signed)
*  STAT* If patient is at the pharmacy, call can be transferred to refill team.   1. Which medications need to be refilled? (please list name of each medication and dose if known)   metoprolol  succinate (TOPROL  XL) 25 MG 24 hr tablet  rosuvastatin  (CRESTOR ) 5 MG tablet    2. Would you like to learn more about the convenience, safety, & potential cost savings by using the [facility]?   3. Are you open to using the [facility] (Type [facility]. ).  4. Which pharmacy/location (including street and city if local pharmacy) is medication to be sent to?  [id number] - [place] ([place]), [place] - [address]   5. Do they need a 30 day or 90 day supply? 90 day  Patient stated her pharmacy told her they have not received these prescription as yet and wants them re-sent.

## 2023-11-10 ENCOUNTER — Ambulatory Visit (HOSPITAL_COMMUNITY)
Admission: RE | Admit: 2023-11-10 | Discharge: 2023-11-10 | Disposition: A | Discharge status: Home / Self Care | Source: Ambulatory Visit | Attending: Physician Assistant | Admitting: Physician Assistant

## 2023-11-10 ENCOUNTER — Ambulatory Visit: Payer: Self-pay | Admitting: Physician Assistant

## 2023-11-10 DIAGNOSIS — I10 Essential (primary) hypertension: Secondary | ICD-10-CM | POA: Diagnosis not present

## 2023-11-10 DIAGNOSIS — E782 Mixed hyperlipidemia: Secondary | ICD-10-CM | POA: Diagnosis not present

## 2023-11-10 DIAGNOSIS — I700 Atherosclerosis of aorta: Secondary | ICD-10-CM | POA: Insufficient documentation

## 2023-11-10 DIAGNOSIS — R0989 Other specified symptoms and signs involving the circulatory and respiratory systems: Secondary | ICD-10-CM | POA: Insufficient documentation

## 2023-11-10 DIAGNOSIS — I779 Disorder of arteries and arterioles, unspecified: Secondary | ICD-10-CM | POA: Diagnosis present

## 2023-11-27 ENCOUNTER — Ambulatory Visit (HOSPITAL_BASED_OUTPATIENT_CLINIC_OR_DEPARTMENT_OTHER)
Admission: RE | Admit: 2023-11-27 | Discharge: 2023-11-27 | Disposition: A | Discharge status: Home / Self Care | Source: Ambulatory Visit | Attending: Physician Assistant | Admitting: Physician Assistant

## 2023-11-27 DIAGNOSIS — I340 Nonrheumatic mitral (valve) insufficiency: Secondary | ICD-10-CM | POA: Diagnosis present

## 2023-11-27 DIAGNOSIS — I5189 Other ill-defined heart diseases: Secondary | ICD-10-CM | POA: Insufficient documentation

## 2023-11-27 DIAGNOSIS — I2510 Atherosclerotic heart disease of native coronary artery without angina pectoris: Secondary | ICD-10-CM | POA: Insufficient documentation

## 2023-11-27 LAB — ECHOCARDIOGRAM COMPLETE
AR max vel: 1.54 cm2
AV Area VTI: 1.57 cm2
AV Area mean vel: 1.58 cm2
AV Mean grad: 6 mmHg
AV Peak grad: 10.1 mmHg
Ao pk vel: 1.59 m/s
Area-P 1/2: 5.13 cm2
Calc EF: 61.5 %
MV M vel: 4.92 m/s
MV Peak grad: 96.8 mmHg
S' Lateral: 3 cm
Single Plane A2C EF: 59.3 %
Single Plane A4C EF: 61.4 %

## 2023-11-29 ENCOUNTER — Ambulatory Visit: Payer: Self-pay | Admitting: Physician Assistant
